# Patient Record
Sex: Female | Born: 1970
Health system: Southern US, Community
[De-identification: ages and names within clinical notes are randomized; demographics above are authoritative.]

## PROBLEM LIST (undated history)

## (undated) DIAGNOSIS — M1990 Unspecified osteoarthritis, unspecified site: Secondary | ICD-10-CM

## (undated) DIAGNOSIS — F419 Anxiety disorder, unspecified: Secondary | ICD-10-CM

## (undated) DIAGNOSIS — F329 Major depressive disorder, single episode, unspecified: Secondary | ICD-10-CM

## (undated) DIAGNOSIS — K219 Gastro-esophageal reflux disease without esophagitis: Secondary | ICD-10-CM

## (undated) DIAGNOSIS — K602 Anal fissure, unspecified: Secondary | ICD-10-CM

## (undated) DIAGNOSIS — G43909 Migraine, unspecified, not intractable, without status migrainosus: Secondary | ICD-10-CM

## (undated) DIAGNOSIS — F32A Depression, unspecified: Secondary | ICD-10-CM

## (undated) DIAGNOSIS — E785 Hyperlipidemia, unspecified: Secondary | ICD-10-CM

## (undated) DIAGNOSIS — R7611 Nonspecific reaction to tuberculin skin test without active tuberculosis: Secondary | ICD-10-CM

## (undated) DIAGNOSIS — K589 Irritable bowel syndrome without diarrhea: Secondary | ICD-10-CM

## (undated) HISTORY — DX: Anxiety disorder, unspecified: F41.9

## (undated) HISTORY — PX: ANKLE SURGERY: SHX546

## (undated) HISTORY — DX: Depression, unspecified: F32.A

## (undated) HISTORY — DX: Gastro-esophageal reflux disease without esophagitis: K21.9

## (undated) HISTORY — DX: Migraine, unspecified, not intractable, without status migrainosus: G43.909

## (undated) HISTORY — DX: Unspecified osteoarthritis, unspecified site: M19.90

## (undated) HISTORY — DX: Major depressive disorder, single episode, unspecified: F32.9

## (undated) HISTORY — DX: Irritable bowel syndrome, unspecified: K58.9

## (undated) HISTORY — DX: Nonspecific reaction to tuberculin skin test without active tuberculosis: R76.11

## (undated) HISTORY — PX: APPENDECTOMY: SHX54

## (undated) HISTORY — DX: Hyperlipidemia, unspecified: E78.5

## (undated) HISTORY — DX: Anal fissure, unspecified: K60.2

## (undated) HISTORY — PX: KNEE ARTHROSCOPY: SHX127

---

## 2004-11-12 ENCOUNTER — Other Ambulatory Visit: Admission: RE | Admit: 2004-11-12 | Discharge: 2004-11-12 | Payer: Self-pay | Admitting: Obstetrics and Gynecology

## 2005-08-15 ENCOUNTER — Ambulatory Visit (HOSPITAL_COMMUNITY): Admission: RE | Admit: 2005-08-15 | Discharge: 2005-08-15 | Payer: Self-pay | Admitting: Obstetrics and Gynecology

## 2005-08-15 IMAGING — RF DG HYSTEROGRAM
8 series · 8 of 8 positions shown · IV contrast (omnipaque)
Comparison: none

CLINICAL DATA: nfertility.
HYSTEROSALPINGOGRAM:
TECHNIQUE: Following cleansing of the cervix and vagina with Betadine, a hysterosalpingogram catheter was placed into the endocervical canal.  Hysterosalpingogram was then performed using Omnipaque 300 contrast.  The patient tolerated the examination without difficulty.

[Series 1: run · 1 of 1 slices shown (1 of 8)]
[im 1/1]
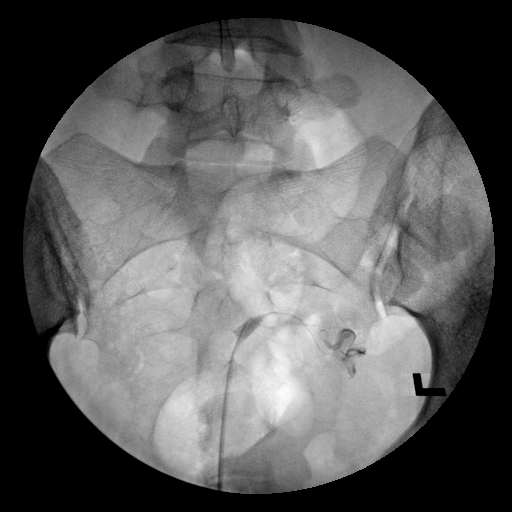

[Series 2: run · 1 of 1 slices shown (2 of 8)]
[im 1/1]
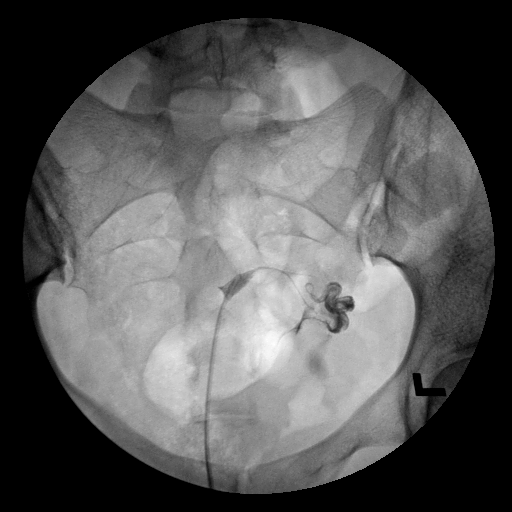

[Series 3: run · 1 of 1 slices shown (3 of 8)]
[im 1/1]
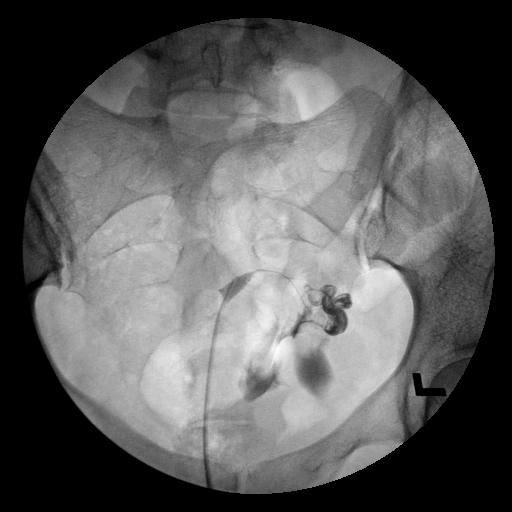

[Series 4: run · 1 of 1 slices shown (4 of 8)]
[im 1/1]
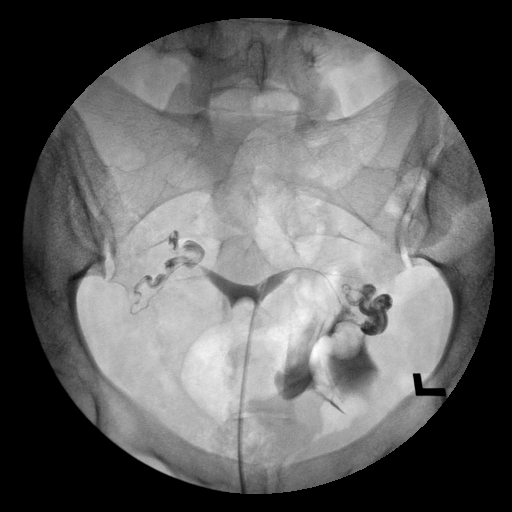

[Series 5: run · 1 of 1 slices shown (5 of 8)]
[im 1/1]
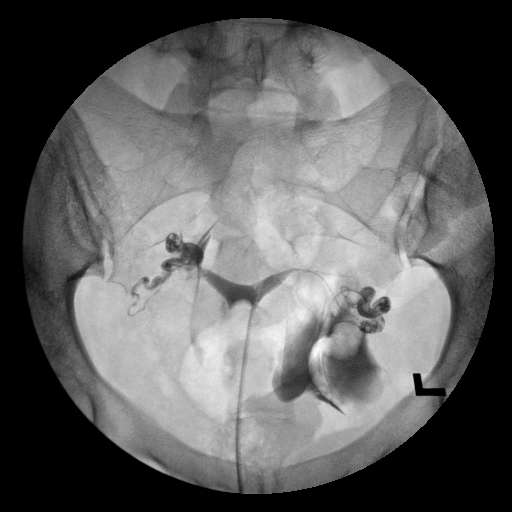

[Series 6: run · 1 of 1 slices shown (6 of 8)]
[im 1/1]
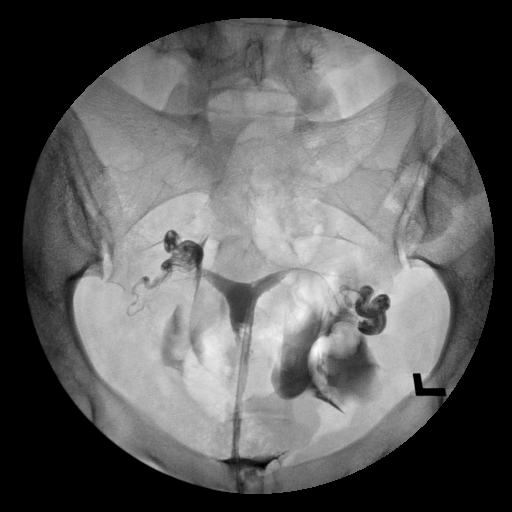

[Series 7: run · 1 of 1 slices shown (7 of 8)]
[im 1/1]
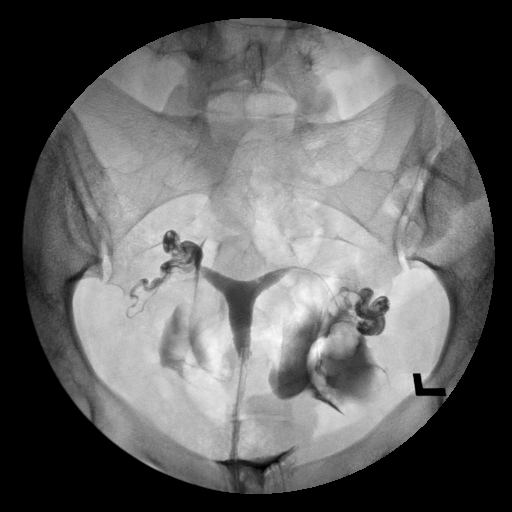

[Series 8: run · 1 of 1 slices shown (8 of 8)]
[im 1/1]
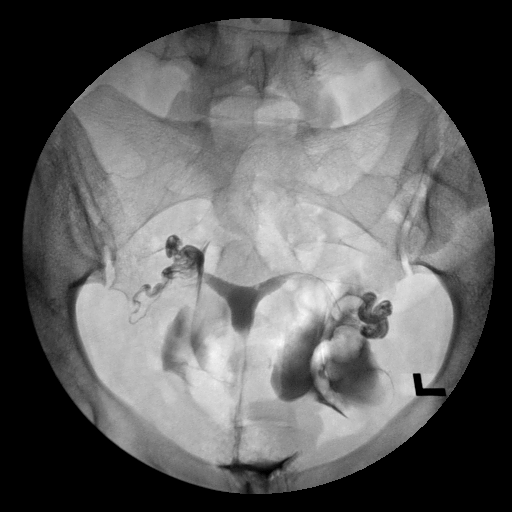

[8 of 8 positions shown; findings below may reference images not displayed]

FINDINGS: Incidentally noted is an arcuate configuration of the endometrial cavity, which is a normal variant.  Endometrial cavity is otherwise normal in appearance. 
Opacification of both fallopian tubes is seen.  Both fallopian tubes are normal in appearance.  Intraperitoneal spill of contrast in both fallopian tubes is demonstrated.
IMPRESSION: Normal study.  Both fallopian tubes are patent.

## 2005-12-05 ENCOUNTER — Other Ambulatory Visit: Admission: RE | Admit: 2005-12-05 | Discharge: 2005-12-05 | Payer: Self-pay | Admitting: Obstetrics and Gynecology

## 2006-03-30 ENCOUNTER — Encounter: Admission: RE | Admit: 2006-03-30 | Discharge: 2006-03-30 | Payer: Self-pay | Admitting: Obstetrics and Gynecology

## 2006-05-08 ENCOUNTER — Inpatient Hospital Stay (HOSPITAL_COMMUNITY): Admission: AD | Admit: 2006-05-08 | Discharge: 2006-05-10 | Payer: Self-pay | Admitting: Obstetrics and Gynecology

## 2006-05-08 ENCOUNTER — Encounter (INDEPENDENT_AMBULATORY_CARE_PROVIDER_SITE_OTHER): Payer: Self-pay | Admitting: Specialist

## 2006-05-11 ENCOUNTER — Encounter: Admission: RE | Admit: 2006-05-11 | Discharge: 2006-06-07 | Payer: Self-pay | Admitting: Obstetrics and Gynecology

## 2007-07-10 ENCOUNTER — Other Ambulatory Visit: Admission: RE | Admit: 2007-07-10 | Discharge: 2007-07-10 | Payer: Self-pay | Admitting: Internal Medicine

## 2008-07-10 ENCOUNTER — Other Ambulatory Visit: Admission: RE | Admit: 2008-07-10 | Discharge: 2008-07-10 | Payer: Self-pay | Admitting: Internal Medicine

## 2008-07-21 ENCOUNTER — Ambulatory Visit (HOSPITAL_COMMUNITY): Admission: RE | Admit: 2008-07-21 | Discharge: 2008-07-21 | Payer: Self-pay | Admitting: Internal Medicine

## 2009-07-13 ENCOUNTER — Other Ambulatory Visit: Admission: RE | Admit: 2009-07-13 | Discharge: 2009-07-13 | Payer: Self-pay | Admitting: Internal Medicine

## 2010-07-13 ENCOUNTER — Ambulatory Visit (HOSPITAL_COMMUNITY)
Admission: RE | Admit: 2010-07-13 | Discharge: 2010-07-13 | Payer: Self-pay | Source: Home / Self Care | Attending: Internal Medicine | Admitting: Internal Medicine

## 2010-11-12 NOTE — Discharge Summary (Signed)
NAME:  Maria Arias, Maria Arias NO.:  [id number]   MEDICAL RECORD NO.:  [id number]          PATIENT TYPE:  INP   LOCATION:                                FACILITY:  [facility]   PHYSICIAN:  Huel Cote, M.D. DATE OF BIRTH:  1970-12-22   DATE OF ADMISSION:  05/08/2006  DATE OF DISCHARGE:  05/10/2006                               DISCHARGE SUMMARY   DISCHARGE DIAGNOSES:  1. Preterm pregnancy at 34-5/7th's weeks, delivered.  2. Spontaneous rupture of membranes and spontaneous labor after that.  3. Status post precipitous vaginal delivery.   DISCHARGE MEDICATIONS:  1. Motrin 600 mg p.o. every 6 hours.  2. Percocet 1-2 tablets p.o. every 4 hours p.r.n.   DISCHARGE FOLLOWUP:  The patient is to follow up in 6 weeks for her  routine postpartum exam.   HOSPITAL COURSE:  The patient is a 40 year old, G1 P0, who was admitted  at 34-5/7th's weeks' gestation, after she presented to the office with a  spontaneous rupture of membranes and onset of painful contractions.  In  the office she was noted to be completely effaced, 4+ centimeters and a  zero station and was sent immediately to labor and delivery.  Prenatal  care been complicated by gestational diabetes which was well controlled  on her diet.  She did have advanced maternal age with a normal first  trimester screen and some history of anxiety and depression.  However,  was stable off Zoloft throughout her pregnancy.   Prenatal labs are as follows:  B+, antibody negative, RPR nonreactive,  rubella immune, hepatitis B surface antigen negative, GC negative,  chlamydia negative, group B strep was unknown.   PAST OBSTETRICAL HISTORY:  None.   PAST GYNECOLOGIC HISTORY:  History of fertility with conception on  Clomid.   PAST MEDICAL HISTORY:  1. Irritable bowel syndrome.  2. Depression.  3. Anxiety.   PAST SURGICAL HISTORY:  Appendectomy in 1985.   SHE HAD NO KNOWN DRUG ALLERGIES.   Shortly after admission, the patient  was examined and found to be  completely effaced  and 7-to-8-cm dilated.  She was given ampicillin 2  grams IV for questionable group B strep status, and her preterm  gestational age.  She quickly progressed to complete dilation after  arrival in L&D and pushed for approximately 30 minutes with a normal  spontaneous vaginal delivery of a viable female infant over a first-  degree laceration.  Apgar's were 8 and 9, weight was 4 pounds 12 ounces.  There was a nuchal cord x1 which was reduced over the head.  Placenta  delivered spontaneously.  A 1% lidocaine block was used for repair of  the laceration with 2-0 Vicryl.  NICU was in attendance with the baby  taken to NICU, given the early gestational age and history of diabetes  in the mother.  The infant had initially done quite well and was  breathing unassisted initially in the delivery room.  The patient was  then admitted for routine postpartum care.  By postpartum day #2, she  was doing well.  No complaints.  Fundus was  firm.  She was afebrile with  stable vital signs.  The baby was in the NICU, however, was quite stable  and was just being followed for its early gestational age, therefore,  the patient was felt stable for discharge home and was discharged to  follow up in the office in 6 weeks.      Huel Cote, M.D.  Electronically Signed     KR/MEDQ  D:  08/04/2006  T:  08/04/2006  Job:  [id number]

## 2011-07-12 ENCOUNTER — Other Ambulatory Visit (HOSPITAL_COMMUNITY): Payer: Self-pay | Admitting: Emergency Medicine

## 2011-07-12 DIAGNOSIS — Z1231 Encounter for screening mammogram for malignant neoplasm of breast: Secondary | ICD-10-CM

## 2011-08-03 ENCOUNTER — Other Ambulatory Visit: Payer: Self-pay | Admitting: Emergency Medicine

## 2011-08-03 ENCOUNTER — Other Ambulatory Visit (HOSPITAL_COMMUNITY)
Admission: RE | Admit: 2011-08-03 | Discharge: 2011-08-03 | Disposition: A | Discharge status: Home / Self Care | Payer: BC Managed Care – PPO | Source: Ambulatory Visit | Attending: Internal Medicine | Admitting: Internal Medicine

## 2011-08-03 DIAGNOSIS — Z01419 Encounter for gynecological examination (general) (routine) without abnormal findings: Secondary | ICD-10-CM | POA: Insufficient documentation

## 2011-08-04 LAB — HM PAP SMEAR: HM Pap smear: NEGATIVE

## 2011-08-05 ENCOUNTER — Other Ambulatory Visit (HOSPITAL_COMMUNITY): Payer: Self-pay | Admitting: Internal Medicine

## 2011-08-05 ENCOUNTER — Other Ambulatory Visit: Payer: Self-pay | Admitting: Internal Medicine

## 2011-08-05 DIAGNOSIS — N63 Unspecified lump in unspecified breast: Secondary | ICD-10-CM

## 2011-08-05 DIAGNOSIS — M8580 Other specified disorders of bone density and structure, unspecified site: Secondary | ICD-10-CM

## 2011-08-05 DIAGNOSIS — N644 Mastodynia: Secondary | ICD-10-CM

## 2011-08-10 ENCOUNTER — Ambulatory Visit (HOSPITAL_COMMUNITY): Payer: BC Managed Care – PPO

## 2011-08-10 ENCOUNTER — Ambulatory Visit (HOSPITAL_COMMUNITY): Payer: Self-pay

## 2011-08-15 ENCOUNTER — Ambulatory Visit
Admission: RE | Admit: 2011-08-15 | Discharge: 2011-08-15 | Disposition: A | Discharge status: Home / Self Care | Payer: BC Managed Care – PPO | Source: Ambulatory Visit | Attending: Internal Medicine | Admitting: Internal Medicine

## 2011-08-15 ENCOUNTER — Ambulatory Visit (HOSPITAL_COMMUNITY)
Admission: RE | Admit: 2011-08-15 | Discharge: 2011-08-15 | Disposition: A | Discharge status: Home / Self Care | Payer: BC Managed Care – PPO | Source: Ambulatory Visit | Attending: Internal Medicine | Admitting: Internal Medicine

## 2011-08-15 ENCOUNTER — Other Ambulatory Visit: Payer: Self-pay | Admitting: Internal Medicine

## 2011-08-15 DIAGNOSIS — N63 Unspecified lump in unspecified breast: Secondary | ICD-10-CM

## 2011-08-15 DIAGNOSIS — N644 Mastodynia: Secondary | ICD-10-CM

## 2011-08-15 DIAGNOSIS — Z13820 Encounter for screening for osteoporosis: Secondary | ICD-10-CM | POA: Insufficient documentation

## 2011-08-15 DIAGNOSIS — M8580 Other specified disorders of bone density and structure, unspecified site: Secondary | ICD-10-CM

## 2011-08-15 LAB — HM DEXA SCAN: HM DEXA SCAN: NEGATIVE

## 2011-08-15 LAB — HM MAMMOGRAPHY: HM MAMMO: NEGATIVE

## 2012-08-13 ENCOUNTER — Ambulatory Visit (HOSPITAL_COMMUNITY)
Admission: RE | Admit: 2012-08-13 | Discharge: 2012-08-13 | Disposition: A | Discharge status: Home / Self Care | Payer: BC Managed Care – PPO | Source: Ambulatory Visit | Attending: Internal Medicine | Admitting: Internal Medicine

## 2012-08-13 ENCOUNTER — Other Ambulatory Visit (HOSPITAL_COMMUNITY): Payer: Self-pay | Admitting: Emergency Medicine

## 2012-08-13 ENCOUNTER — Other Ambulatory Visit: Payer: Self-pay | Admitting: Internal Medicine

## 2012-08-13 ENCOUNTER — Other Ambulatory Visit (HOSPITAL_COMMUNITY): Payer: Self-pay | Admitting: Internal Medicine

## 2012-08-13 DIAGNOSIS — R7611 Nonspecific reaction to tuberculin skin test without active tuberculosis: Secondary | ICD-10-CM | POA: Insufficient documentation

## 2012-08-13 DIAGNOSIS — Z1231 Encounter for screening mammogram for malignant neoplasm of breast: Secondary | ICD-10-CM

## 2012-08-13 DIAGNOSIS — R51 Headache: Secondary | ICD-10-CM

## 2012-08-13 IMAGING — CR DG CHEST 2V
2 series · 2 of 2 positions shown · non-contrast
Comparison: No priors.

CLINICAL DATA: Positive PPD.  No current chest complaints.

CHEST - 2 VIEW

[view not recorded (1 of 2)]
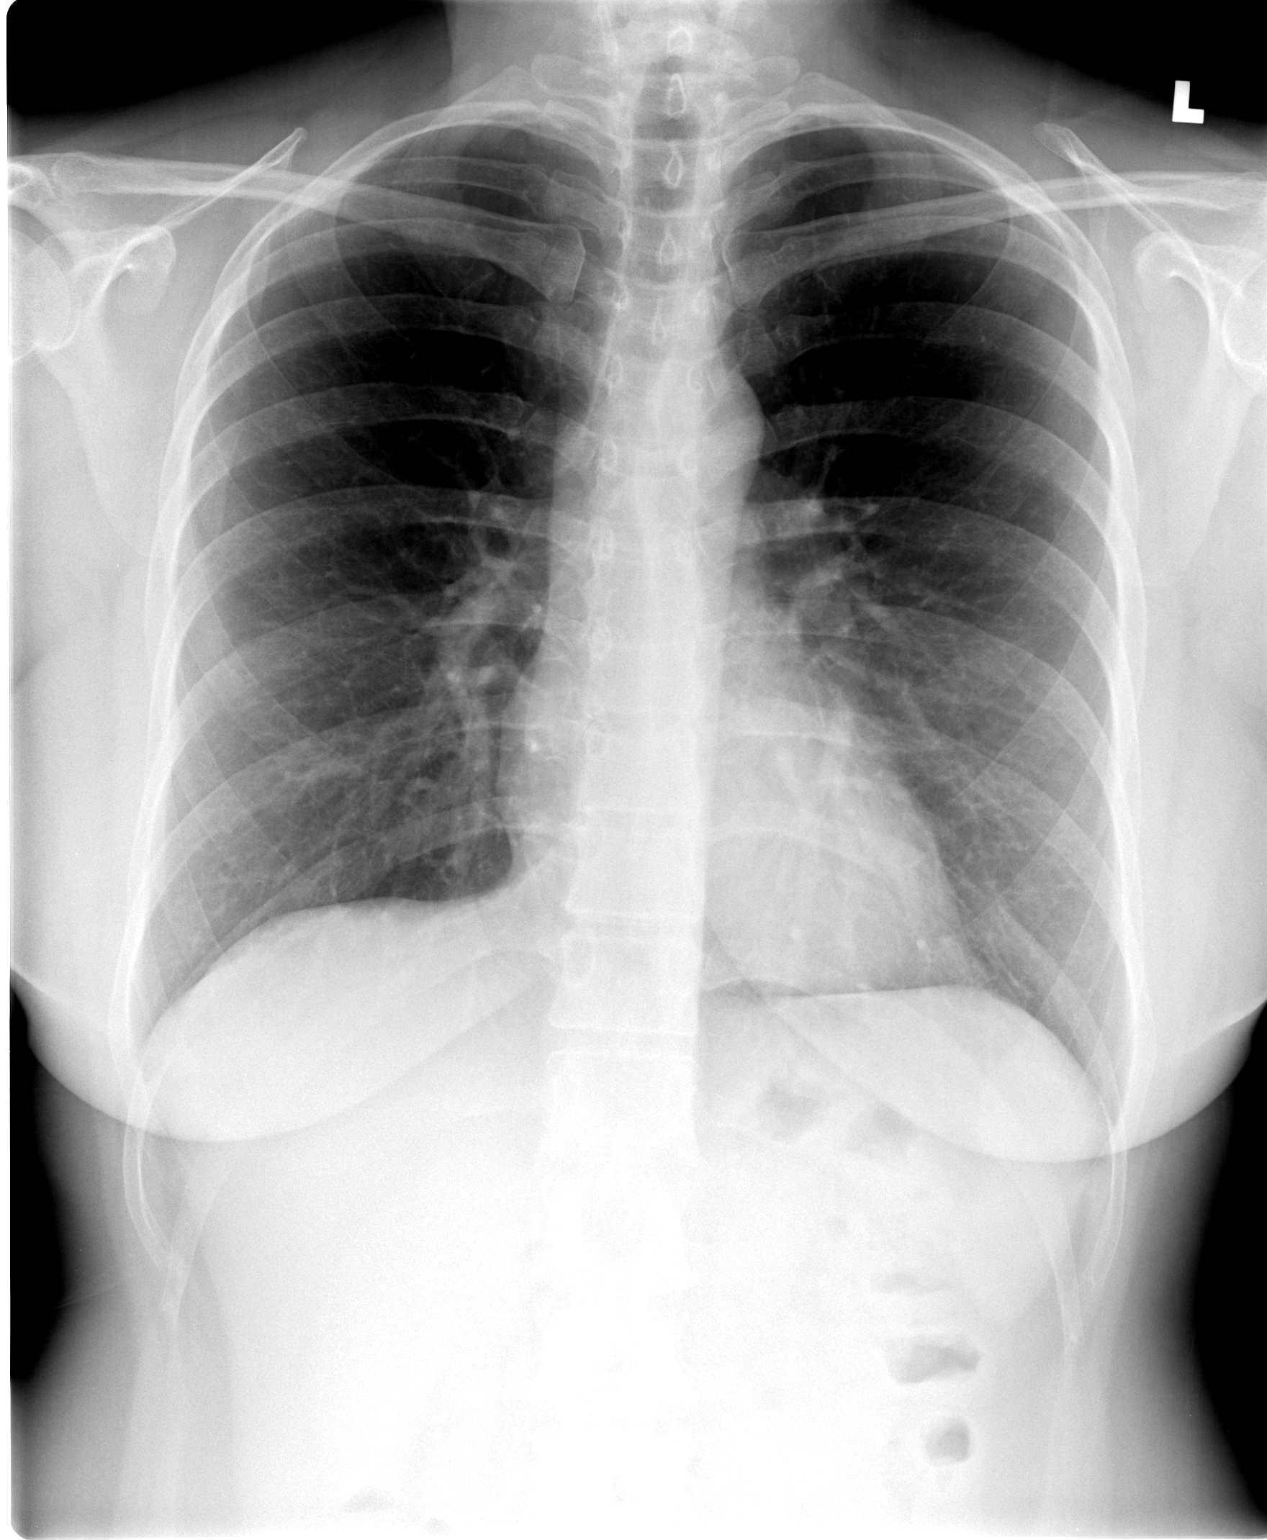

[view not recorded (2 of 2)]
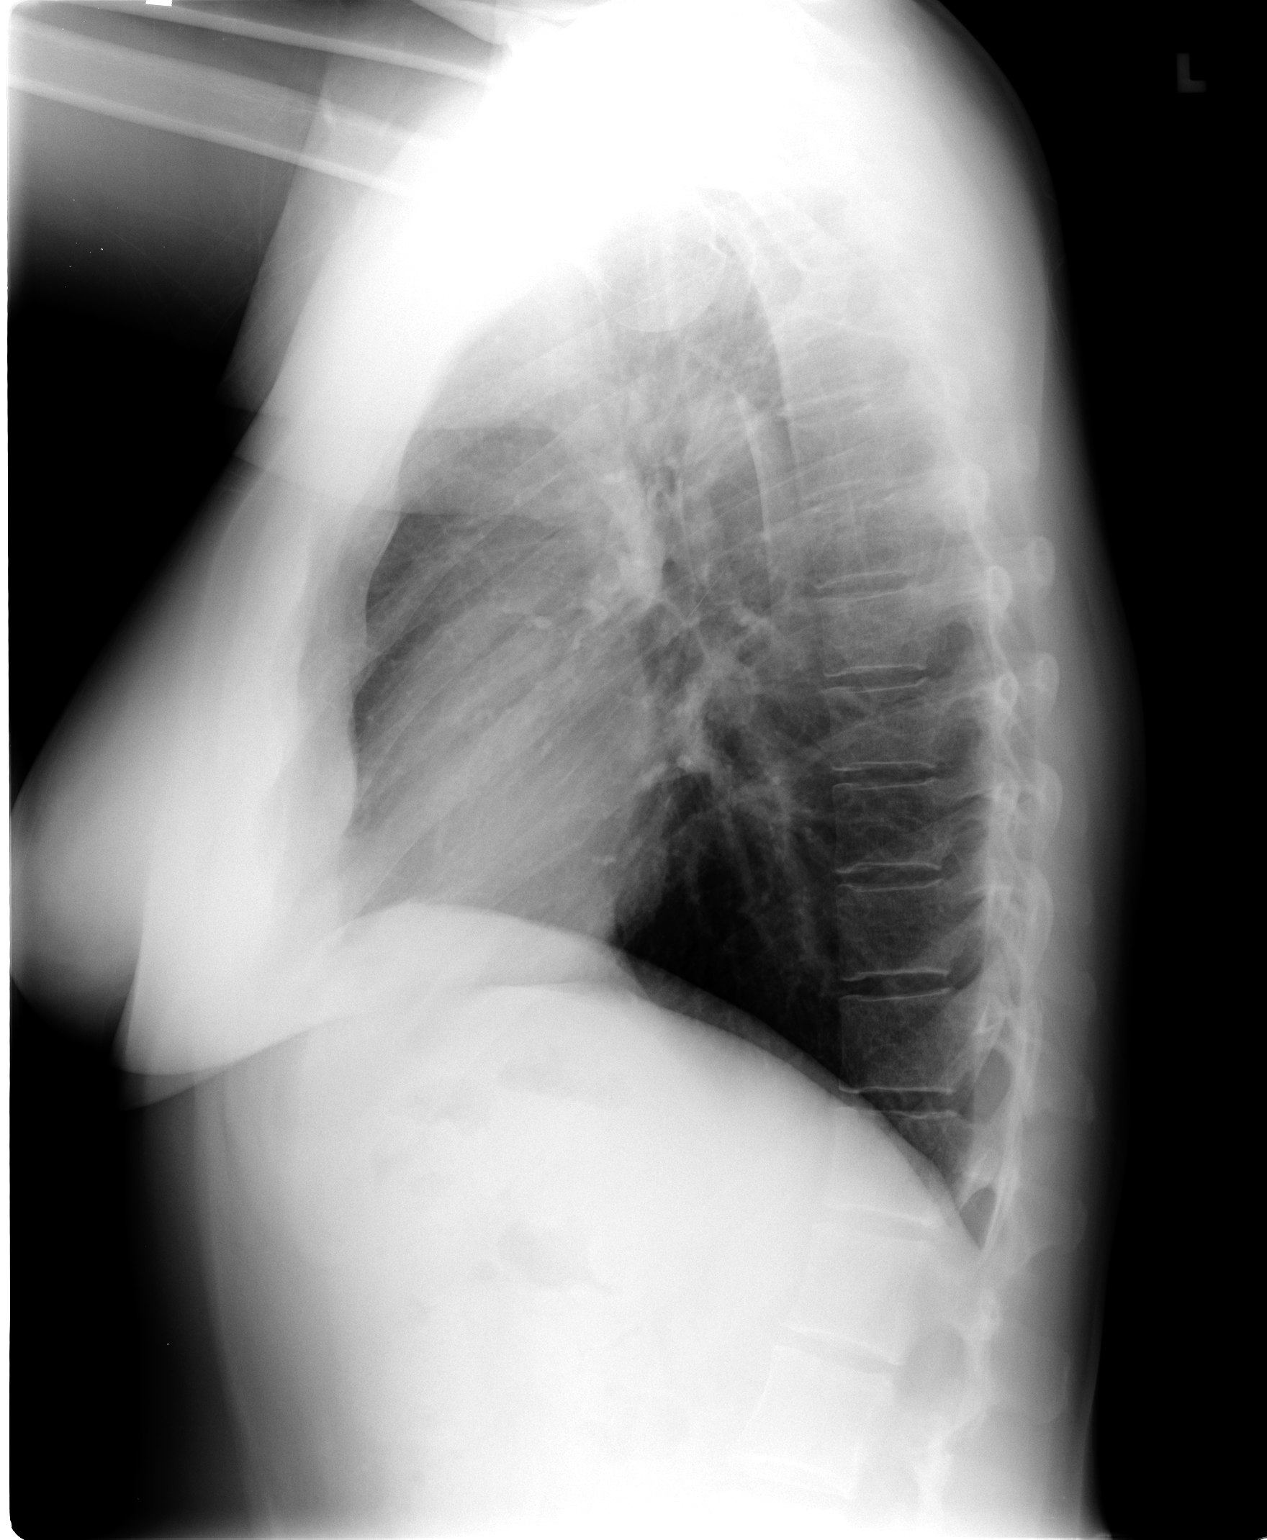

[2 of 2 positions shown; findings below may reference images not displayed]

FINDINGS: Lung volumes are normal.  No consolidative airspace
disease.  No pleural effusions.  No pneumothorax.  No pulmonary
nodule or mass noted.  Pulmonary vasculature and the
cardiomediastinal silhouette are within normal limits.
IMPRESSION: 1. No radiographic evidence of acute cardiopulmonary disease.
2.  Specifically, no radiographic findings to suggest active
intrathoracic tuberculosis.

## 2012-08-16 ENCOUNTER — Other Ambulatory Visit: Payer: BC Managed Care – PPO

## 2012-08-16 ENCOUNTER — Ambulatory Visit
Admission: RE | Admit: 2012-08-16 | Discharge: 2012-08-16 | Disposition: A | Discharge status: Home / Self Care | Payer: BC Managed Care – PPO | Source: Ambulatory Visit | Attending: Internal Medicine | Admitting: Internal Medicine

## 2012-08-16 DIAGNOSIS — R51 Headache: Secondary | ICD-10-CM

## 2012-08-16 IMAGING — CT CT HEAD W/O CM
2 series · 16 of 30 positions shown, 20 images · non-contrast
Comparison: None.

CLINICAL DATA: .  Headache

CT HEAD WITHOUT CONTRAST
TECHNIQUE: Contiguous axial images were obtained from the base of
the skull through the vertex without contrast

[Series 2: head w/o · axial · non-contrast · 0.45mm/px · z∈[+28,+154]mm · 13 of 28 slices shown, 17 images]
[im 2/28  brain]
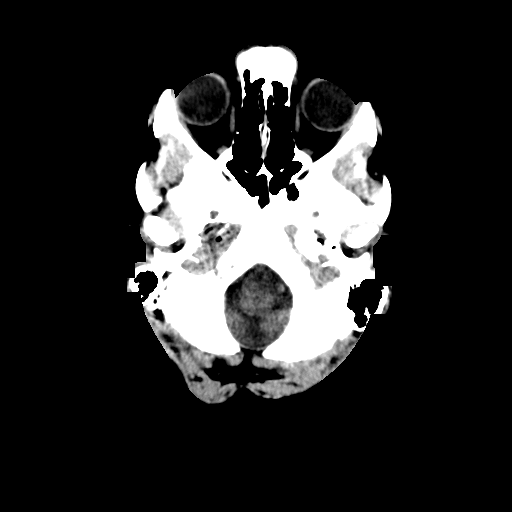
[im 2/28  bone]
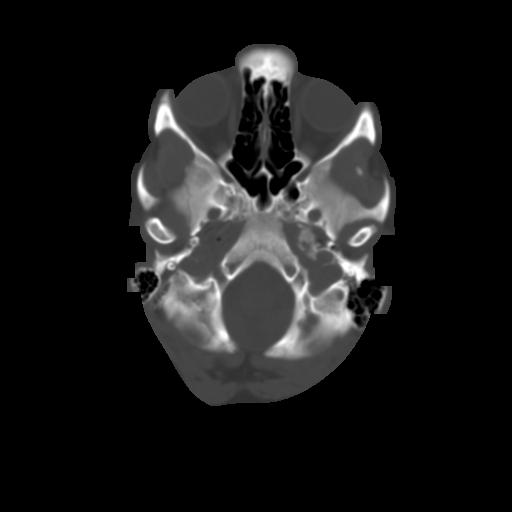
[im 4/28  brain]
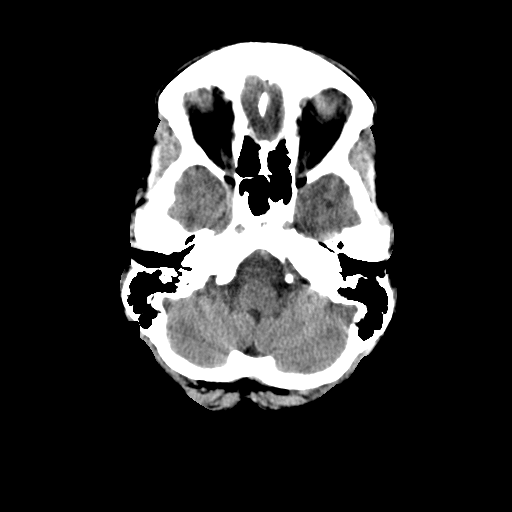
[im 6/28  brain]
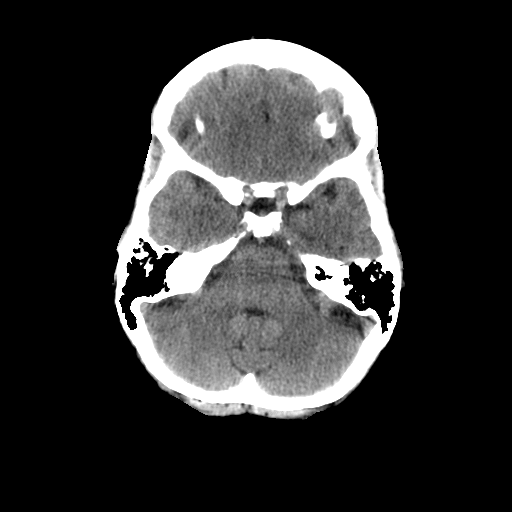
[im 8/28  brain]
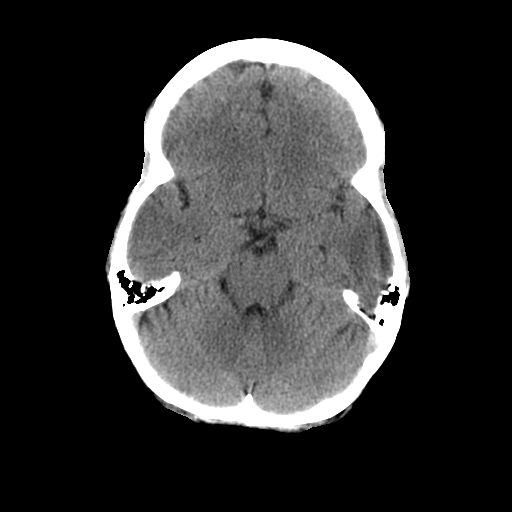
[im 10/28  brain]
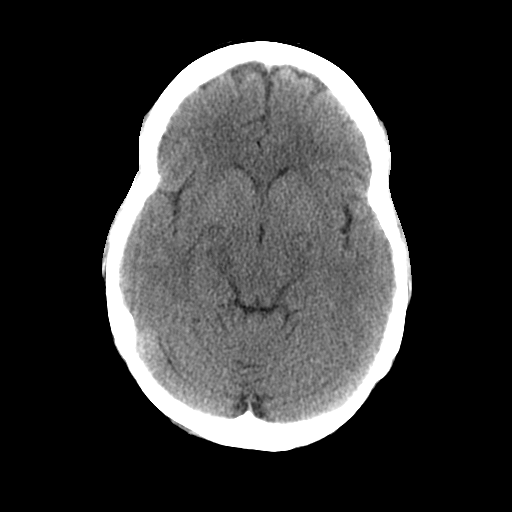
[im 10/28  bone]
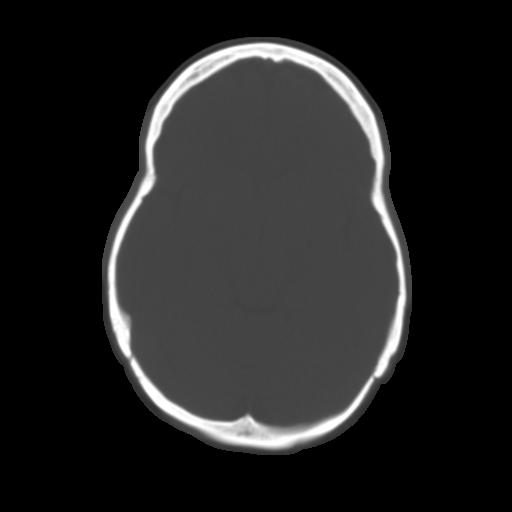
[im 12/28  brain]
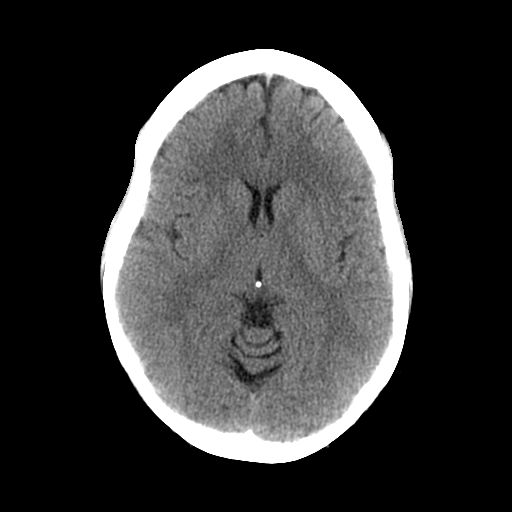
[im 14/28  brain]
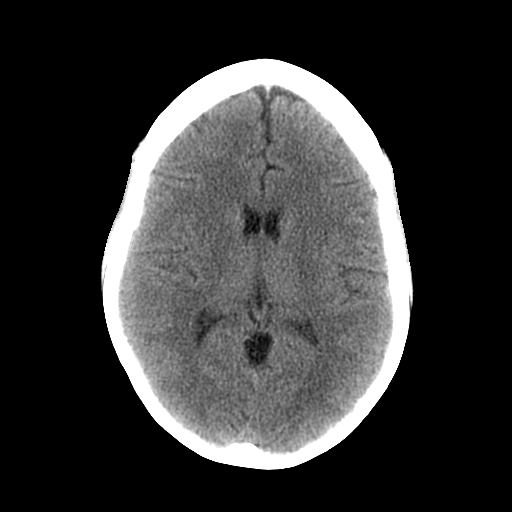
[im 16/28  brain]
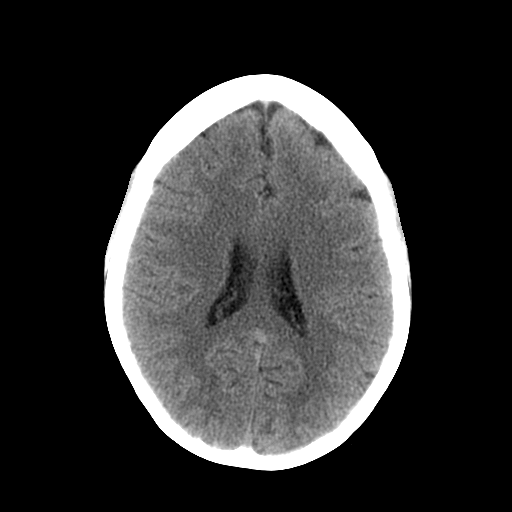
[im 18/28  brain]
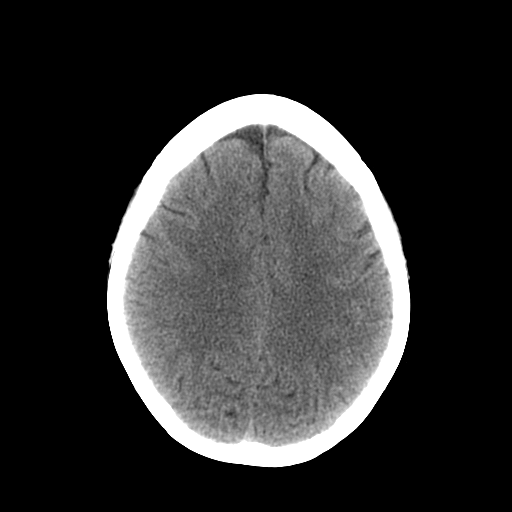
[im 18/28  bone]
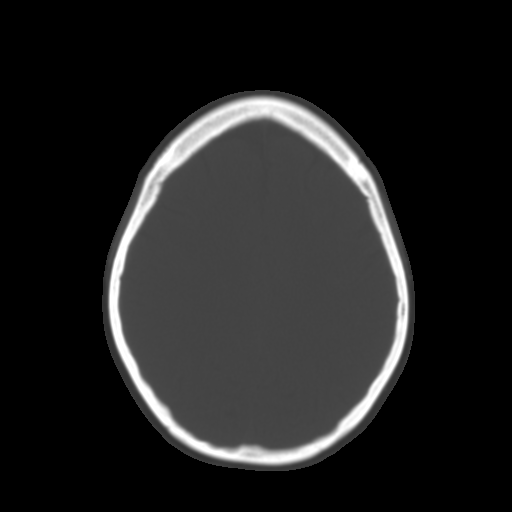
[im 20/28  brain]
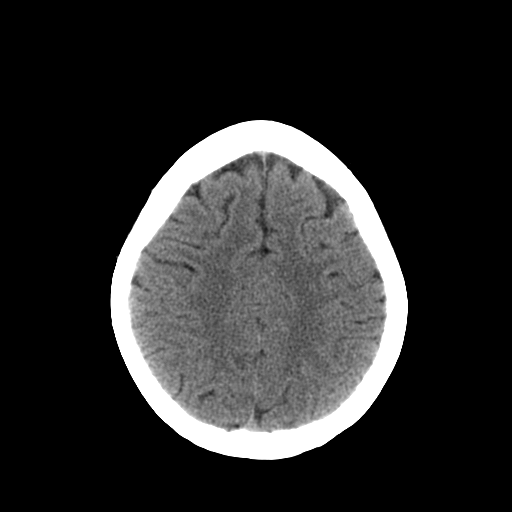
[im 22/28  brain]
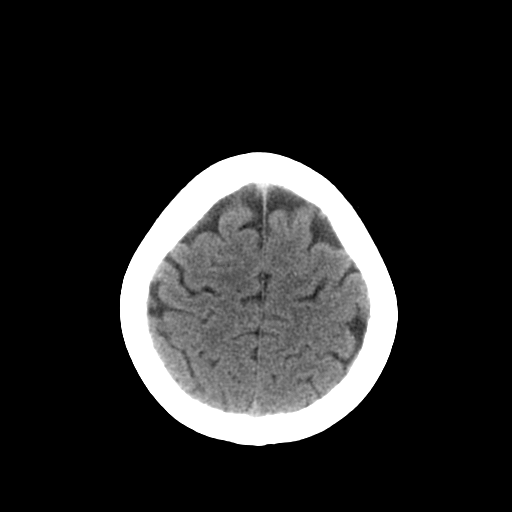
[im 24/28  brain]
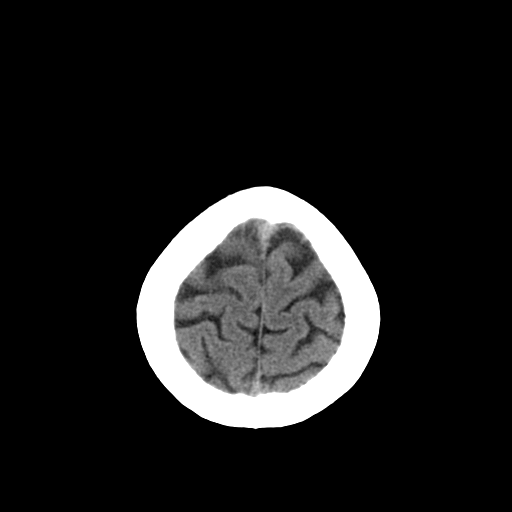
[im 26/28  brain]
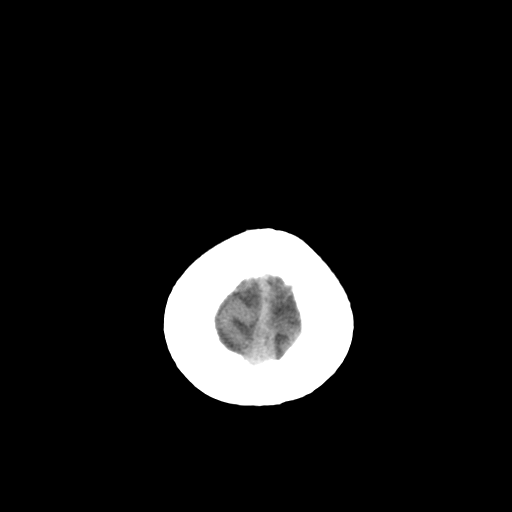
[im 26/28  bone]
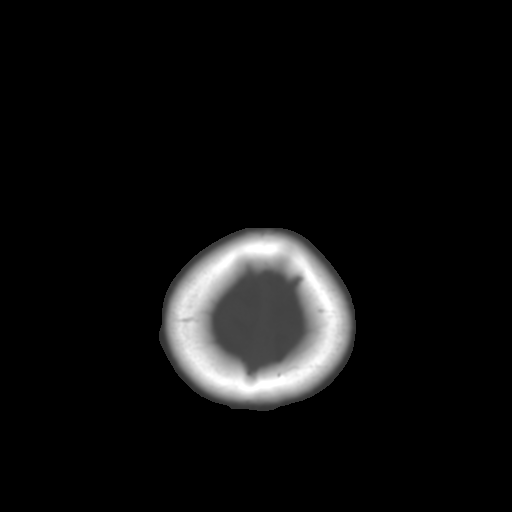

[Series 3: head bone · axial · 0.45mm/px · z∈[+28,+70]mm · 3 of 28 slices shown]
[im 2/28  bone]
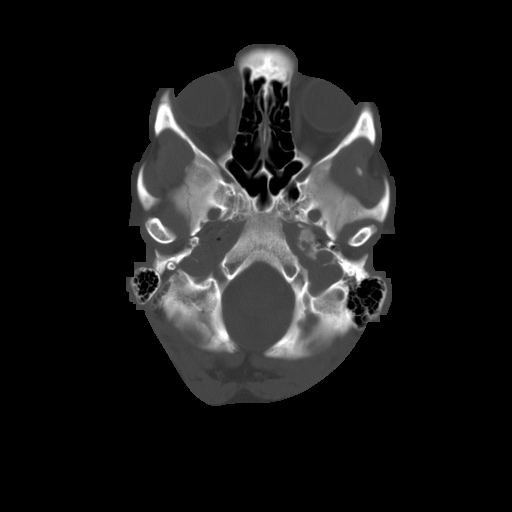
[im 6/28  bone]
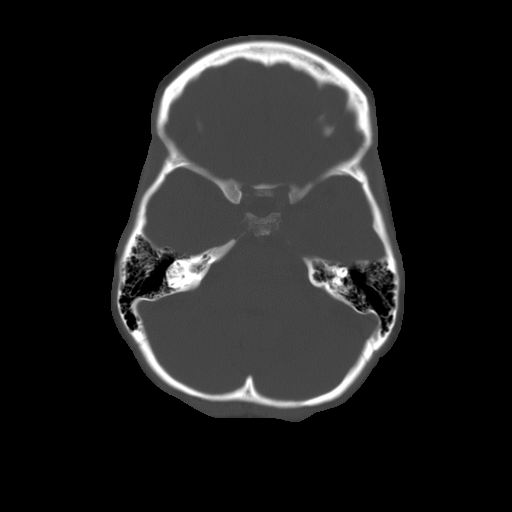
[im 10/28  bone]
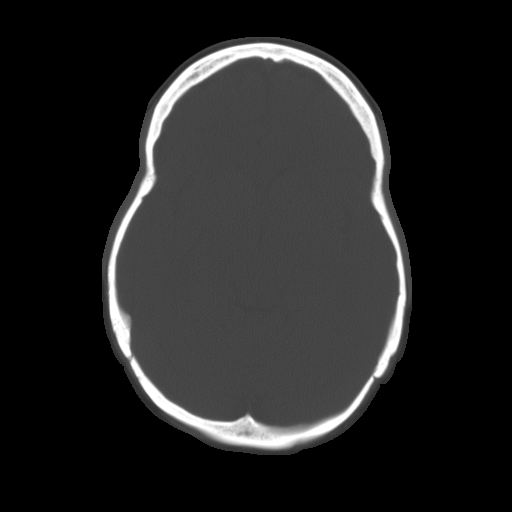

[16 of 30 positions shown; findings below may reference images not displayed]

FINDINGS: The brain has a normal appearance without evidence for
hemorrhage, acute infarction, hydrocephalus, or mass lesion.  There
is no extra axial fluid collection.  The skull and paranasal
sinuses are normal.
IMPRESSION: Normal CT of the head without contrast.

## 2012-08-24 ENCOUNTER — Ambulatory Visit (HOSPITAL_COMMUNITY): Payer: BC Managed Care – PPO

## 2012-08-31 ENCOUNTER — Ambulatory Visit (HOSPITAL_COMMUNITY)
Admission: RE | Admit: 2012-08-31 | Discharge: 2012-08-31 | Disposition: A | Discharge status: Home / Self Care | Payer: BC Managed Care – PPO | Source: Ambulatory Visit | Attending: Emergency Medicine | Admitting: Emergency Medicine

## 2012-08-31 DIAGNOSIS — Z1231 Encounter for screening mammogram for malignant neoplasm of breast: Secondary | ICD-10-CM | POA: Insufficient documentation

## 2013-05-15 ENCOUNTER — Ambulatory Visit: Payer: BC Managed Care – PPO | Admitting: Physician Assistant

## 2013-05-15 ENCOUNTER — Encounter: Payer: Self-pay | Admitting: Physician Assistant

## 2013-05-15 VITALS — BP 128/78 | HR 96 | Resp 16 | Ht 63.5 in | Wt 159.0 lb

## 2013-05-15 DIAGNOSIS — E785 Hyperlipidemia, unspecified: Secondary | ICD-10-CM | POA: Insufficient documentation

## 2013-05-15 DIAGNOSIS — F329 Major depressive disorder, single episode, unspecified: Secondary | ICD-10-CM | POA: Insufficient documentation

## 2013-05-15 DIAGNOSIS — G43909 Migraine, unspecified, not intractable, without status migrainosus: Secondary | ICD-10-CM | POA: Insufficient documentation

## 2013-05-15 DIAGNOSIS — F419 Anxiety disorder, unspecified: Secondary | ICD-10-CM | POA: Insufficient documentation

## 2013-05-15 DIAGNOSIS — K219 Gastro-esophageal reflux disease without esophagitis: Secondary | ICD-10-CM | POA: Insufficient documentation

## 2013-05-15 DIAGNOSIS — J029 Acute pharyngitis, unspecified: Secondary | ICD-10-CM

## 2013-05-15 DIAGNOSIS — R7611 Nonspecific reaction to tuberculin skin test without active tuberculosis: Secondary | ICD-10-CM | POA: Insufficient documentation

## 2013-05-15 MED ORDER — PROMETHAZINE-CODEINE 6.25-10 MG/5ML PO SYRP: 5.0000 mL | ORAL_SOLUTION | Freq: Four times a day (QID) | ORAL | Status: DC | PRN

## 2013-05-15 MED ORDER — PREDNISONE (PAK) 10 MG PO TABS: ORAL_TABLET | ORAL | Status: DC

## 2013-05-15 MED ORDER — AZITHROMYCIN 250 MG PO TABS: 250.0000 mg | ORAL_TABLET | Freq: Every day | ORAL | Status: DC

## 2013-05-15 NOTE — Progress Notes (Signed)
  Subjective:    Patient ID: Maria Arias, female    DOB: 02-09-1971, 42 y.o.   MRN: [id number]  Cough This is a new problem. The current episode started in the past 7 days. The problem has been gradually worsening. The problem occurs constantly. The cough is productive of sputum. Associated symptoms include chills, ear congestion, nasal congestion, postnasal drip and a sore throat. Pertinent negatives include no ear pain, fever, headaches, hemoptysis, myalgias, rash, rhinorrhea, shortness of breath, sweats, weight loss or wheezing. Exacerbated by: dry heat. She has tried nothing for the symptoms. The treatment provided no relief. Her past medical history is significant for environmental allergies.   Past Medical History  Diagnosis Date  . GERD (gastroesophageal reflux disease)   . Hyperlipidemia   . Anxiety   . Depression   . Migraines   . 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   No current facility-administered medications on file prior to visit.    Review of Systems  Constitutional: Positive for chills. Negative for fever and weight loss.  HENT: Positive for congestion, postnasal drip, sinus pressure and sore throat. Negative for ear pain, mouth sores, nosebleeds and rhinorrhea.   Eyes: Negative.   Respiratory: Positive for cough and chest tightness. Negative for apnea, hemoptysis, choking, shortness of breath, wheezing and stridor.   Cardiovascular: Negative.   Gastrointestinal: Negative.   Endocrine: Negative.   Genitourinary: Negative.   Musculoskeletal: Negative for myalgias.  Skin: Negative for rash.  Allergic/Immunologic: Positive for environmental allergies.  Neurological: Negative.  Negative for headaches.  Hematological: Negative.        Objective:   Physical Exam  Constitutional: She appears well-developed and well-nourished.  HENT:  Head: Normocephalic and atraumatic.  Right  Ear: Hearing normal. Tympanic membrane is not injected, not scarred, not perforated and not erythematous. A middle ear effusion is present.  Left Ear: Tympanic membrane is not injected, not scarred, not perforated and not erythematous. A middle ear effusion is present.  Mouth/Throat: Uvula is midline and mucous membranes are normal. Posterior oropharyngeal edema and posterior oropharyngeal erythema present. No oropharyngeal exudate or tonsillar abscesses.  Eyes: Conjunctivae are normal. Pupils are equal, round, and reactive to light.  Neck: Normal range of motion. Neck supple.  Cardiovascular: Normal rate, regular rhythm and normal heart sounds.   Pulmonary/Chest: Effort normal and breath sounds normal. No respiratory distress. She has no wheezes. She has no rales. She exhibits no tenderness.  Abdominal: Soft. Bowel sounds are normal.  Lymphadenopathy:    She has cervical adenopathy.  Skin: Skin is warm and dry.       Assessment & Plan:  1. Acute pharyngitis Rest, fluids, and call if needed.  - azithromycin (ZITHROMAX) 250 MG tablet; Take 1 tablet (250 mg total) by mouth daily.  Dispense: 6 each; Refill: 0 - promethazine-codeine (PHENERGAN WITH CODEINE) 6.25-10 MG/5ML syrup; Take 5 mLs by mouth every 6 (six) hours as needed for cough.  Dispense: 240 mL; Refill: 0 - predniSONE (STERAPRED UNI-PAK) 10 MG tablet; Take one tablet twice a day for 3 days and take one tablet daily with food for 4 days.  Dispense: 10 tablet; Refill: 0

## 2013-05-15 NOTE — Patient Instructions (Signed)

## 2013-06-03 ENCOUNTER — Other Ambulatory Visit: Payer: Self-pay | Admitting: Emergency Medicine

## 2013-06-03 MED ORDER — TRAZODONE HCL 100 MG PO TABS: ORAL_TABLET | ORAL | Status: DC

## 2013-06-04 ENCOUNTER — Other Ambulatory Visit: Payer: Self-pay | Admitting: Emergency Medicine

## 2013-06-04 MED ORDER — ETONOGESTREL-ETHINYL ESTRADIOL 0.12-0.015 MG/24HR VA RING: VAGINAL_RING | VAGINAL | Status: DC

## 2013-06-12 ENCOUNTER — Other Ambulatory Visit: Payer: Self-pay | Admitting: Emergency Medicine

## 2013-06-12 MED ORDER — AMOXICILLIN-POT CLAVULANATE 875-125 MG PO TABS: 1.0000 | ORAL_TABLET | Freq: Two times a day (BID) | ORAL | Status: AC

## 2013-06-12 MED ORDER — BENZONATATE 100 MG PO CAPS: 100.0000 mg | ORAL_CAPSULE | Freq: Three times a day (TID) | ORAL | Status: DC | PRN

## 2013-06-23 ENCOUNTER — Other Ambulatory Visit: Payer: Self-pay | Admitting: Emergency Medicine

## 2013-06-23 MED ORDER — SERTRALINE HCL 100 MG PO TABS: 100.0000 mg | ORAL_TABLET | Freq: Every day | ORAL | Status: DC

## 2013-07-05 ENCOUNTER — Other Ambulatory Visit: Payer: Self-pay | Admitting: Emergency Medicine

## 2013-07-05 MED ORDER — TRAZODONE HCL 100 MG PO TABS: ORAL_TABLET | ORAL | Status: DC

## 2013-07-08 ENCOUNTER — Encounter: Payer: Self-pay | Admitting: Emergency Medicine

## 2013-07-09 ENCOUNTER — Ambulatory Visit (INDEPENDENT_AMBULATORY_CARE_PROVIDER_SITE_OTHER): Payer: BC Managed Care – PPO | Admitting: Emergency Medicine

## 2013-07-11 ENCOUNTER — Encounter: Payer: Self-pay | Admitting: Emergency Medicine

## 2013-07-11 ENCOUNTER — Encounter: Payer: Self-pay | Admitting: Gastroenterology

## 2013-07-11 ENCOUNTER — Ambulatory Visit (INDEPENDENT_AMBULATORY_CARE_PROVIDER_SITE_OTHER): Payer: BC Managed Care – PPO | Admitting: Emergency Medicine

## 2013-07-11 VITALS — BP 108/78 | HR 82 | Temp 98.2°F | Resp 18 | Ht 63.5 in | Wt 155.0 lb

## 2013-07-11 DIAGNOSIS — K625 Hemorrhage of anus and rectum: Secondary | ICD-10-CM

## 2013-07-11 DIAGNOSIS — R5381 Other malaise: Secondary | ICD-10-CM

## 2013-07-11 DIAGNOSIS — R5383 Other fatigue: Principal | ICD-10-CM

## 2013-07-11 DIAGNOSIS — D649 Anemia, unspecified: Secondary | ICD-10-CM

## 2013-07-11 DIAGNOSIS — K649 Unspecified hemorrhoids: Secondary | ICD-10-CM

## 2013-07-11 DIAGNOSIS — R197 Diarrhea, unspecified: Secondary | ICD-10-CM

## 2013-07-11 DIAGNOSIS — E782 Mixed hyperlipidemia: Secondary | ICD-10-CM

## 2013-07-11 DIAGNOSIS — Z79899 Other long term (current) drug therapy: Secondary | ICD-10-CM

## 2013-07-11 DIAGNOSIS — E559 Vitamin D deficiency, unspecified: Secondary | ICD-10-CM

## 2013-07-11 NOTE — Progress Notes (Signed)
Subjective:    Patient ID: Maria Arias, female    DOB: 1970-10-09, 43 y.o.   MRN: [id number]  HPI Comments: 43 yo female cholesterol f/u. She is eating healthier and keeping busy. LAST LABS T 185 TG 116 H 56 L 106 D 28 She has lost 20 # since seperating from husband. She has had decreased stress but is still having IBS with diarrhea. She is now having increased hemorrhoid difficulty and has hx of fissures. She has noticed increased blood loss with hemorrhoids. She feels great and is doing well, she notes she is drinking a little more ETOH on weekends occasionally. She stays a little tired even with wt loss and decreased stress.    Current Outpatient Prescriptions on File Prior to Visit  Medication Sig Dispense Refill  . etonogestrel-ethinyl estradiol (NUVARING) 0.12-0.015 MG/24HR vaginal ring Insert vaginally and leave in place for 3 consecutive weeks, then remove for 1 week.  1 each  11  . sertraline (ZOLOFT) 100 MG tablet Take 1 tablet (100 mg total) by mouth daily. Take 1 and  A 1/2 pills =150mg  daily  135 tablet  0  . SUMAtriptan (IMITREX) 100 MG tablet Take 100 mg by mouth every 2 (two) hours as needed for migraine or headache. May repeat in 2 hours if headache persists or recurs.      . topiramate (TOPAMAX) 100 MG tablet Take 100 mg by mouth 2 (two) times daily.      . traZODone (DESYREL) 100 MG tablet Take 1.5 pills QHS  45 tablet  1  . esomeprazole (NEXIUM) 40 MG packet Take 40 mg by mouth daily before breakfast.      . lactase (LACTAID) 3000 UNITS tablet Take by mouth as needed.       No current facility-administered medications on file prior to visit.   ALLEGRIES Ppd Positive  Past Medical History  Diagnosis Date  . GERD (gastroesophageal reflux disease)   . Hyperlipidemia   . Anxiety   . Depression   . Migraines   . Positive PPD     Positive Gold PPD , Negative Chest Xray     Review of Systems  Constitutional: Positive for fatigue and unexpected weight change.   Gastrointestinal: Positive for diarrhea and anal bleeding.  All other systems reviewed and are negative.   BP 108/78  Pulse 82  Temp(Src) 98.2 F (36.8 C) (Temporal)  Resp 18  Ht 5' 3.5" (1.613 m)  Wt 155 lb (70.308 kg)  BMI 27.02 kg/m2  LMP 06/03/2013     Objective:   Physical Exam  Nursing note and vitals reviewed. Constitutional: She is oriented to person, place, and time. She appears well-developed and well-nourished. No distress.  HENT:  Head: Normocephalic and atraumatic.  Right Ear: External ear normal.  Left Ear: External ear normal.  Nose: Nose normal.  Mouth/Throat: Oropharynx is clear and moist.  Eyes: Conjunctivae and EOM are normal.  Neck: Normal range of motion. Neck supple. No JVD present. No thyromegaly present.  Cardiovascular: Normal rate, regular rhythm, normal heart sounds and intact distal pulses.   Pulmonary/Chest: Effort normal and breath sounds normal.  Abdominal: Soft. Bowel sounds are normal. She exhibits no distension and no mass. There is no tenderness. There is no rebound and no guarding.  Genitourinary:  NT, non-thrombosed dime size hemorrhoid  Musculoskeletal: Normal range of motion. She exhibits no edema and no tenderness.  Lymphadenopathy:    She has no cervical adenopathy.  Neurological: She is alert and oriented  to person, place, and time. No cranial nerve deficit.  Skin: Skin is warm and dry. No rash noted. No erythema. No pallor.  Psychiatric: She has a normal mood and affect. Her behavior is normal. Judgment and thought content normal.          Assessment & Plan:  1. Cholesterol- recheck labs, Need to eat healthier and exercise AD. 2. Wt Loss/ Fatigue- check labs, increase activity and H2O 3. Diarrhea/ Hemorrhoids/ rectal bleeding- Ref to GI, probiotics QD

## 2013-07-11 NOTE — Patient Instructions (Signed)
  Rectal Bleeding  Rectal bleeding is when blood comes out of the opening of the butt (anus). Rectal bleeding may show up as bright red blood or really dark poop (stool). The poop may look dark red, maroon, or black. Rectal bleeding is often a sign that something is wrong. This needs to be checked by a doctor.  HOME CARE  Eat a diet high in fiber. This will help keep your poop soft.  Limit activitiy.  Drink enough fluids to keep your pee (urine) clear or pale yellow.  Take a warm bath to soothe any pain.  Follow up with your doctor as told. GET HELP RIGHT AWAY IF:  You have more bleeding.  You have black or dark red poop.  You throw up (vomit) blood or it looks like coffee grounds.  You have belly (abdominal) pain or tenderness.  You have a fever.  You feel weak, sick to your stomach (nauseous), or you pass out (faint).  You have pain that is so bad you cannot poop (bowel movement). MAKE SURE YOU:  Understand these instructions.  Will watch your condition.  Will get help right away if you are not doing well or get worse. Document Released: 02/23/2011 Document Revised: 09/05/2011 Document Reviewed: 02/23/2011 [employer] Patient Information 2014 [employer].  

## 2013-07-12 LAB — HEPATIC FUNCTION PANEL
ALBUMIN: 3.9 g/dL (ref 3.5–5.2)
ALT: 14 U/L (ref 0–35)
AST: 15 U/L (ref 0–37)
Alkaline Phosphatase: 47 U/L (ref 39–117)
Bilirubin, Direct: 0.1 mg/dL (ref 0.0–0.3)
Indirect Bilirubin: 0.3 mg/dL (ref 0.0–0.9)
TOTAL PROTEIN: 6.4 g/dL (ref 6.0–8.3)
Total Bilirubin: 0.4 mg/dL (ref 0.3–1.2)

## 2013-07-12 LAB — LIPID PANEL
CHOL/HDL RATIO: 3.2 ratio
CHOLESTEROL: 187 mg/dL (ref 0–200)
HDL: 59 mg/dL (ref >39)
LDL Cholesterol: 103 mg/dL — ABNORMAL HIGH (ref 0–99)
Triglycerides: 125 mg/dL (ref <150)
VLDL: 25 mg/dL (ref 0–40)

## 2013-07-12 LAB — IRON AND TIBC
%SAT: 35 % (ref 20–55)
Iron: 113 ug/dL (ref 42–145)
TIBC: 327 ug/dL (ref 250–470)
UIBC: 214 ug/dL (ref 125–400)

## 2013-07-12 LAB — CBC WITH DIFFERENTIAL/PLATELET
BASOS PCT: 1 % (ref 0–1)
Basophils Absolute: 0 10*3/uL (ref 0.0–0.1)
EOS ABS: 0.1 10*3/uL (ref 0.0–0.7)
Eosinophils Relative: 1 % (ref 0–5)
HEMATOCRIT: 39.8 % (ref 36.0–46.0)
Hemoglobin: 13.3 g/dL (ref 12.0–15.0)
Lymphocytes Relative: 36 % (ref 12–46)
Lymphs Abs: 1.9 10*3/uL (ref 0.7–4.0)
MCH: 30.7 pg (ref 26.0–34.0)
MCHC: 33.4 g/dL (ref 30.0–36.0)
MCV: 91.9 fL (ref 78.0–100.0)
MONOS PCT: 13 % — AB (ref 3–12)
Monocytes Absolute: 0.7 10*3/uL (ref 0.1–1.0)
NEUTROS PCT: 49 % (ref 43–77)
Neutro Abs: 2.5 10*3/uL (ref 1.7–7.7)
Platelets: 236 10*3/uL (ref 150–400)
RBC: 4.33 MIL/uL (ref 3.87–5.11)
RDW: 13.7 % (ref 11.5–15.5)
WBC: 5.1 10*3/uL (ref 4.0–10.5)

## 2013-07-12 LAB — BASIC METABOLIC PANEL WITH GFR
BUN: 11 mg/dL (ref 6–23)
CO2: 21 mEq/L (ref 19–32)
Calcium: 8.6 mg/dL (ref 8.4–10.5)
Chloride: 108 mEq/L (ref 96–112)
Creat: 0.87 mg/dL (ref 0.50–1.10)
GFR, Est Non African American: 82 mL/min
GLUCOSE: 92 mg/dL (ref 70–99)
POTASSIUM: 3.5 meq/L (ref 3.5–5.3)
Sodium: 138 mEq/L (ref 135–145)

## 2013-07-12 LAB — VITAMIN B12: Vitamin B-12: 357 pg/mL (ref 211–911)

## 2013-07-12 LAB — FOLATE RBC: RBC Folate: 454 ng/mL (ref >280)

## 2013-07-12 LAB — VITAMIN D 25 HYDROXY (VIT D DEFICIENCY, FRACTURES): Vit D, 25-Hydroxy: 32 ng/mL (ref 30–89)

## 2013-07-12 LAB — MAGNESIUM: Magnesium: 2 mg/dL (ref 1.5–2.5)

## 2013-07-12 LAB — TSH: TSH: 1.585 u[IU]/mL (ref 0.350–4.500)

## 2013-07-14 NOTE — Progress Notes (Signed)
Patient ID: Maria Arias, female   DOB: 04/20/1971, 43 y.o.   MRN: [id number] NO visit patient had to leave before exam

## 2013-07-15 ENCOUNTER — Telehealth: Payer: Self-pay | Admitting: *Deleted

## 2013-07-15 NOTE — Telephone Encounter (Signed)
Discussed patient's most recent lab results and instructions.

## 2013-07-15 NOTE — Telephone Encounter (Signed)
Message copied by Laurence SpatesHELMER, Kataleena Holsapple A on Mon Jul 15, 2013 12:53 PM ------      Message from: Beryl JunctionSMITH, [place]ELISSA R      Created: Mon Jul 15, 2013  7:28 AM       VIT D add 5000 IU, magnesium add 250 mg, add super B all are low and may help with energy. Cholesterol still mild elevation, continue to improve diet and cardio. Recheck 3-4 month OV ------

## 2013-07-16 ENCOUNTER — Encounter: Payer: Self-pay | Admitting: Emergency Medicine

## 2013-07-24 ENCOUNTER — Other Ambulatory Visit: Payer: Self-pay | Admitting: Emergency Medicine

## 2013-07-24 MED ORDER — SERTRALINE HCL 100 MG PO TABS: 100.0000 mg | ORAL_TABLET | Freq: Every day | ORAL | Status: DC

## 2013-07-26 ENCOUNTER — Ambulatory Visit (INDEPENDENT_AMBULATORY_CARE_PROVIDER_SITE_OTHER): Payer: BC Managed Care – PPO | Admitting: Gastroenterology

## 2013-07-26 ENCOUNTER — Encounter: Payer: Self-pay | Admitting: Gastroenterology

## 2013-07-26 VITALS — BP 100/70 | HR 64 | Ht 63.5 in | Wt 154.8 lb

## 2013-07-26 DIAGNOSIS — K644 Residual hemorrhoidal skin tags: Secondary | ICD-10-CM

## 2013-07-26 DIAGNOSIS — K602 Anal fissure, unspecified: Secondary | ICD-10-CM

## 2013-07-26 DIAGNOSIS — R197 Diarrhea, unspecified: Secondary | ICD-10-CM

## 2013-07-26 DIAGNOSIS — K648 Other hemorrhoids: Secondary | ICD-10-CM | POA: Insufficient documentation

## 2013-07-26 MED ORDER — AMBULATORY NON FORMULARY MEDICATION: Status: DC

## 2013-07-26 NOTE — Progress Notes (Signed)
_                                                                                                                History of Present Illness: Pleasant 43 year old white female referred for evaluation of rectal bleeding and pain.  She has a history of an anal fissure.  For months she has been complaining of burning with a bowel movement with subsequent aching.  She's had small amounts of blood in the toilet and on the tissue.  She tends to have loose stools 2-3 times a day.  She is also bothered by an external skin tag that causes problems with cleansing.  Pain is bleeding has been an intermittent problem for years.    Past Medical History  Diagnosis Date  . GERD (gastroesophageal reflux disease)   . Hyperlipidemia   . Anxiety   . Depression   . Migraines   . 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 Anal fissure   . Irritable bowel disease    Past Surgical History  Procedure Laterality Date  . Appendectomy    . Knee arthroscopy Right   . Ankle surgery Left    family history includes Depression in her mother; Diabetes in her paternal uncle; Heart disease in her maternal grandfather; Hyperlipidemia in her father; Hypertension in her father. There is no history of Colon cancer or Esophageal cancer. Current Outpatient Prescriptions  Medication Sig Dispense Refill  . etonogestrel-ethinyl estradiol (NUVARING) 0.12-0.015 MG/24HR vaginal ring Insert vaginally and leave in place for 3 consecutive weeks, then remove for 1 week.  1 each  11  . sertraline (ZOLOFT) 100 MG tablet Take 1 tablet (100 mg total) by mouth daily. Take 1 and  A 1/2 pills =150mg  daily  135 tablet  3  . SUMAtriptan (IMITREX) 100 MG tablet Take 100 mg by mouth every 2 (two) hours as needed for migraine or headache. May repeat in 2 hours if headache persists or recurs.      . topiramate (TOPAMAX) 100 MG tablet Take 100 mg by mouth 2 (two) times daily.      . traZODone (DESYREL) 100 MG  tablet Take 1.5 pills QHS  45 tablet  1   No current facility-administered medications for this visit.   Allergies as of 07/26/2013 - Review Complete 07/26/2013  Allergen Reaction Noted  . Ppd [tuberculin purified protein derivative]  07/08/2013    reports that she has never smoked. She does not have any smokeless tobacco history on file. She reports that she drinks alcohol. Her drug history is not on file.     Review of Systems: Pertinent positive and negative review of systems were noted in the above HPI section. All other review of systems were otherwise negative.  Vital signs were reviewed in today's medical record Physical Exam: General: Well developed , well nourished, no acute distress Skin: anicteric Head: Normocephalic and atraumatic Eyes:  sclerae anicteric, EOMI Ears: Normal auditory acuity Mouth: No deformity or  lesions Neck: Supple, no masses or thyromegaly Lungs: Clear throughout to auscultation Heart: Regular rate and rhythm; no murmurs, rubs or bruits Abdomen: Soft, non tender and non distended. No masses, hepatosplenomegaly or hernias noted. Normal Bowel sounds Rectal: Visual examination demonstrates a external skin tag and a fissure at the posterior midline Musculoskeletal: Symmetrical with no gross deformities  Skin: No lesions on visible extremities Pulses:  Normal pulses noted Extremities: No clubbing, cyanosis, edema or deformities noted Neurological: Alert oriented x 4, grossly nonfocal Cervical Nodes:  No significant cervical adenopathy Inguinal Nodes: No significant inguinal adenopathy Psychological:  Alert and cooperative. Normal mood and affect  See Assessment and Plan under Problem List

## 2013-07-26 NOTE — Assessment & Plan Note (Signed)
She is symptomatic with persistent bleeding and itching.  She has tried topicals in the past.  I would consider band ligation at a later date while treating her anal fissure

## 2013-07-26 NOTE — Assessment & Plan Note (Signed)
Rectal bleeding and pain are likely to 2 the anal fissure  Recommendations #1 fiber supplementation daily #2 nitroglycerin ointment

## 2013-07-26 NOTE — Assessment & Plan Note (Signed)
Diarrhea is probably combination of stress and IBS.  Plan fiber supplementation for now.

## 2013-07-26 NOTE — Assessment & Plan Note (Signed)
She wishes to have this removed.  Will refer to Gen. surgery.

## 2013-07-26 NOTE — Patient Instructions (Signed)
You have been scheduled for an appointment with _____ at [facility]. Your appointment is on _____ at ____. Please arrive at _____ for registration. Make certain to bring a list of current medications, including any over the counter medications or vitamins. Also bring your co-pay if you have one as well as your insurance cards. [facility] is located at [address], [address]. Should you need to reschedule your appointment, please contact them at [phone].   We are giving you a printed prescription of Nitroglycerin ointment [facility] to fill Follow up in 6 weeks   Use fiber daily  High-Fiber Diet Fiber is found in fruits, vegetables, and grains. A high-fiber diet encourages the addition of more whole grains, legumes, fruits, and vegetables in your diet. The recommended amount of fiber for adult males is 38 g per day. For adult females, it is 25 g per day. Pregnant and lactating women should get 28 g of fiber per day. If you have a digestive or bowel problem, ask your caregiver for advice before adding high-fiber foods to your diet. Eat a variety of high-fiber foods instead of only a select few type of foods.  PURPOSE  To increase stool bulk.  To make bowel movements more regular to prevent constipation.  To lower cholesterol.  To prevent overeating. WHEN IS THIS DIET USED?  It may be used if you have constipation and hemorrhoids.  It may be used if you have uncomplicated diverticulosis (intestine condition) and irritable bowel syndrome.  It may be used if you need help with weight management.  It may be used if you want to add it to your diet as a protective measure against atherosclerosis, diabetes, and cancer. SOURCES OF FIBER  Whole-grain breads and cereals.  Fruits, such as apples, oranges, bananas, berries, prunes, and pears.  Vegetables, such as green peas, carrots, sweet potatoes, beets, broccoli, cabbage,  spinach, and artichokes.  Legumes, such split peas, soy, lentils.  Almonds. FIBER CONTENT IN FOODS Starches and Grains / Dietary Fiber (g)  Cheerios, 1 cup / 3 g  Corn Flakes cereal, 1 cup / 0.7 g  Rice crispy treat cereal, 1 cup / 0.3 g  Instant oatmeal (cooked),  cup / 2 g  Frosted wheat cereal, 1 cup / 5.1 g  Brown, long-grain rice (cooked), 1 cup / 3.5 g  White, long-grain rice (cooked), 1 cup / 0.6 g  Enriched macaroni (cooked), 1 cup / 2.5 g Legumes / Dietary Fiber (g)  Baked beans (canned, plain, or vegetarian),  cup / 5.2 g  Kidney beans (canned),  cup / 6.8 g  Pinto beans (cooked),  cup / 5.5 g Breads and Crackers / Dietary Fiber (g)  Plain or honey graham crackers, 2 squares / 0.7 g  Saltine crackers, 3 squares / 0.3 g  Plain, salted pretzels, 10 pieces / 1.8 g  Whole-wheat bread, 1 slice / 1.9 g  White bread, 1 slice / 0.7 g  Raisin bread, 1 slice / 1.2 g  Plain bagel, 3 oz / 2 g  Flour tortilla, 1 oz / 0.9 g  Corn tortilla, 1 small / 1.5 g  Hamburger or hotdog bun, 1 small / 0.9 g Fruits / Dietary Fiber (g)  Apple with skin, 1 medium / 4.4 g  Sweetened applesauce,  cup / 1.5 g  Banana,  medium / 1.5 g  Grapes, 10 grapes / 0.4 g  Orange, 1 small / 2.3 g  Raisin,  1.5 oz / 1.6 g  Melon, 1 cup / 1.4 g Vegetables / Dietary Fiber (g)  Green beans (canned),  cup / 1.3 g  Carrots (cooked),  cup / 2.3 g  Broccoli (cooked),  cup / 2.8 g  Peas (cooked),  cup / 4.4 g  Mashed potatoes,  cup / 1.6 g  Lettuce, 1 cup / 0.5 g  Corn (canned),  cup / 1.6 g  Tomato,  cup / 1.1 g Document Released: 06/13/2005 Document Revised: 12/13/2011 Document Reviewed: 09/15/2011 [facility] Patient Information 2014 [place], [place].

## 2013-08-08 ENCOUNTER — Encounter: Payer: Self-pay | Admitting: Emergency Medicine

## 2013-08-08 ENCOUNTER — Ambulatory Visit (INDEPENDENT_AMBULATORY_CARE_PROVIDER_SITE_OTHER): Payer: BC Managed Care – PPO | Admitting: Emergency Medicine

## 2013-08-08 VITALS — BP 108/78 | HR 62 | Temp 98.4°F | Resp 18 | Ht 64.0 in | Wt 155.0 lb

## 2013-08-08 DIAGNOSIS — K602 Anal fissure, unspecified: Secondary | ICD-10-CM

## 2013-08-08 DIAGNOSIS — Z0000 Encounter for general adult medical examination without abnormal findings: Secondary | ICD-10-CM

## 2013-08-08 DIAGNOSIS — E559 Vitamin D deficiency, unspecified: Secondary | ICD-10-CM

## 2013-08-08 DIAGNOSIS — Z113 Encounter for screening for infections with a predominantly sexual mode of transmission: Secondary | ICD-10-CM

## 2013-08-08 DIAGNOSIS — Z79899 Other long term (current) drug therapy: Secondary | ICD-10-CM

## 2013-08-08 LAB — CBC WITH DIFFERENTIAL/PLATELET
BASOS ABS: 0 10*3/uL (ref 0.0–0.1)
BASOS PCT: 0 % (ref 0–1)
EOS ABS: 0 10*3/uL (ref 0.0–0.7)
Eosinophils Relative: 1 % (ref 0–5)
HEMATOCRIT: 38.7 % (ref 36.0–46.0)
Hemoglobin: 13.4 g/dL (ref 12.0–15.0)
Lymphocytes Relative: 38 % (ref 12–46)
Lymphs Abs: 2.3 10*3/uL (ref 0.7–4.0)
MCH: 31.2 pg (ref 26.0–34.0)
MCHC: 34.6 g/dL (ref 30.0–36.0)
MCV: 90.2 fL (ref 78.0–100.0)
MONO ABS: 0.5 10*3/uL (ref 0.1–1.0)
MONOS PCT: 9 % (ref 3–12)
Neutro Abs: 3.2 10*3/uL (ref 1.7–7.7)
Neutrophils Relative %: 52 % (ref 43–77)
Platelets: 270 10*3/uL (ref 150–400)
RBC: 4.29 MIL/uL (ref 3.87–5.11)
RDW: 13.7 % (ref 11.5–15.5)
WBC: 6 10*3/uL (ref 4.0–10.5)

## 2013-08-08 LAB — HEMOGLOBIN A1C
Hgb A1c MFr Bld: 5.1 % (ref <5.7)
MEAN PLASMA GLUCOSE: 100 mg/dL (ref <117)

## 2013-08-08 LAB — HIV ANTIBODY (ROUTINE TESTING W REFLEX): HIV: NONREACTIVE

## 2013-08-08 LAB — HEPATITIS PANEL, ACUTE
HCV AB: NEGATIVE
HEP B S AG: NEGATIVE
Hep A IgM: NONREACTIVE
Hep B C IgM: NONREACTIVE

## 2013-08-08 LAB — RPR

## 2013-08-08 NOTE — Patient Instructions (Signed)
Anal Fissure, Adult  An anal fissure is a small tear or crack in the skin around the opening of the butt (anus). Bleeding from the tear or crack usually stops on its own within a few minutes. The bleeding may happen every time you poop until the tear or crack heals.   HOME CARE  · Eat lots of fruit, whole grains, and vegetables. Avoid foods like bananas and dairy products. These foods can make it hard to poop.  · Take a warm water bath (sitz bath) as told by your doctor.  · Drink enough fluids to keep your pee (urine) clear or pale yellow.  · Only take medicines as told by your doctor. Do not take aspirin.  · Do not use numbing creams or hydrocortisone cream on the area. These creams can slow healing.  GET HELP RIGHT AWAY IF:  · Your tear or crack is not healed in 3 days.  · You have more bleeding.  · You have a fever.  · You have watery poop (diarrhea) mixed with blood.  · You have pain.  · You are getting worse, not better.  MAKE SURE YOU:   · Understand these instructions.  · Will watch your condition.  · Will get help right away if you are not doing well or get worse.  Document Released: 02/09/2011 Document Revised: 09/05/2011 Document Reviewed: 02/09/2011  ExitCare® Patient Information ©2014 [employer].

## 2013-08-08 NOTE — Progress Notes (Addendum)
Subjective:    Patient ID: Maria Arias, female    DOB: [birth year], 43 y.o.   MRN: [id number]  HPI Comments: 43 yo pleasant female CPE. She has not added vitamins AD on 07/11/13 visit. She has been doing well overall. She notes she is continuing to stay active and improve diet. She is currently managing stress better with separation from husband. She takes anxiety/ depression meds AD. She has been using the NUVA RING AD but notes is has decreased libido and is considering IUD. She denies any new sexual partners or STD concerns but wants to have screening performed. 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 She denies any current TB symptoms. She was unable to finish Isoniazid treatment due to rash and would like to try Rifampin treatment since she is off GERD RX.   She has been recently evaluated by Dr Arlyce Dice for rectal bleeding and DX of anal fissure/ hemorrhoids was given. She has had improvement with symptoms with NTG cream but has f/u pending for recheck and hemorrhoid banding.      Medication List       This list is accurate as of: 08/08/13 11:59 PM.  Always use your most recent med list.               AMBULATORY NON FORMULARY MEDICATION  Medication Name: Nitroglycerin ointment 0.125% use as needed rectally four times a day     etonogestrel-ethinyl estradiol 0.12-0.015 MG/24HR vaginal ring  Commonly known as:  NUVARING  Insert vaginally and leave in place for 3 consecutive weeks, then remove for 1 week.     sertraline 100 MG tablet  Commonly known as:  ZOLOFT  Take 1 tablet (100 mg total) by mouth daily. Take 1 and  A 1/2 pills =150mg  daily     SUMAtriptan 100 MG tablet  Commonly known as:  IMITREX  Take 100 mg by mouth every 2 (two) hours as needed for migraine or headache. May repeat in 2 hours if headache persists or recurs.     topiramate 100 MG tablet  Commonly known as:  TOPAMAX  Take 100 mg by mouth 2  (two) times daily.     traZODone 100 MG tablet  Commonly known as:  DESYREL  Takes 1/2 pill QHS       Allergies  Allergen Reactions  . Ppd [Tuberculin Purified Protein Derivative]     +ppd with treatment 08/08/12   Past Medical History  Diagnosis Date  . GERD (gastroesophageal reflux disease)   . Hyperlipidemia   . Anxiety   . Depression   . Migraines   . 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 Anal fissure   . Irritable bowel disease    Past Surgical History  Procedure Laterality Date  . Appendectomy    . Knee arthroscopy Right   . Ankle surgery Left    History  Substance Use Topics  . Smoking status: Never Smoker   . Smokeless tobacco: Not on file  . Alcohol Use: Yes     Comment: socially   Family History  Problem Relation Age of Onset  . Depression Mother   . Hypertension Father   . Hyperlipidemia Father   . Colon cancer Neg Hx   . Esophageal cancer Neg Hx   . Heart disease Maternal Grandfather   . Diabetes Paternal Uncle  Review of Systems  All other systems reviewed and are negative.   BP 108/78  Pulse 62  Temp(Src) 98.4 F (36.9 C) (Temporal)  Resp 18  Ht 5\' 4"  (1.626 m)  Wt 155 lb (70.308 kg)  BMI 26.59 kg/m2  LMP 07/14/2013     Objective:   Physical Exam  Nursing note and vitals reviewed. Constitutional: She is oriented to person, place, and time. She appears well-developed and well-nourished. No distress.  HENT:  Head: Normocephalic and atraumatic.  Right Ear: External ear normal.  Left Ear: External ear normal.  Nose: Nose normal.  Mouth/Throat: Oropharynx is clear and moist. No oropharyngeal exudate.  Eyes: Conjunctivae and EOM are normal. Pupils are equal, round, and reactive to light. Right eye exhibits no discharge. Left eye exhibits no discharge. No scleral icterus.  Neck: Normal range of motion. Neck supple. No JVD present. No tracheal deviation present. No thyromegaly present.  Cardiovascular: Normal  rate, regular rhythm, normal heart sounds and intact distal pulses.   Pulmonary/Chest: Effort normal and breath sounds normal.  Abdominal: Soft. Bowel sounds are normal. She exhibits no distension and no mass. There is no tenderness. There is no rebound and no guarding.  Genitourinary:  Manual exam only WNk, Nuva Ring in place Breasts Fibrocystic  Musculoskeletal: Normal range of motion. She exhibits no edema and no tenderness.  Lymphadenopathy:    She has no cervical adenopathy.  Neurological: She is alert and oriented to person, place, and time. She has normal reflexes. No cranial nerve deficit. She exhibits normal muscle tone. Coordination normal.  Skin: Skin is warm and dry. No rash noted. No erythema. No pallor.  Psychiatric: She has a normal mood and affect. Her behavior is normal. Judgment and thought content normal.      EKG WNL NSCSPT    Assessment & Plan:  1.CPE- Update screening labs/ History/ Immunizations/ Testing as needed. Advised healthy diet, QD exercise, increase H20 and continue RX/ Vitamins AD. Repeat CXR for screening. Mammo due 09/01/13 2. TB + with incomplete treatment due to rash with Isoniazid, repeat cxr and wants to start rifampin. Will need to repeat CBC/ CMEt 1 month lab only and use alternate BC. 3. ANAL fissure continue PLAN AD per GI, w/c if SX increase or ER. 4.Mild cholesterol elevation will recheck 3 months with recent lab 07/11/13 5. VIT DEF on 07/11/13 labs- ADD ALL VITAMINS AD 6. Std screening- Per patient request/ DECREASED libido- She declines pap, advised if wants IUD will need GYN eval with PAP

## 2013-08-09 LAB — URINALYSIS, ROUTINE W REFLEX MICROSCOPIC
Bilirubin Urine: NEGATIVE
GLUCOSE, UA: NEGATIVE mg/dL
Hgb urine dipstick: NEGATIVE
KETONES UR: NEGATIVE mg/dL
Nitrite: NEGATIVE
PROTEIN: NEGATIVE mg/dL
Specific Gravity, Urine: 1.021 (ref 1.005–1.030)
Urobilinogen, UA: 0.2 mg/dL (ref 0.0–1.0)
pH: 5.5 (ref 5.0–8.0)

## 2013-08-09 LAB — URINALYSIS, MICROSCOPIC ONLY
BACTERIA UA: NONE SEEN
CASTS: NONE SEEN
CRYSTALS: NONE SEEN
Squamous Epithelial / LPF: NONE SEEN

## 2013-08-09 LAB — MICROALBUMIN / CREATININE URINE RATIO
Creatinine, Urine: 189.8 mg/dL
MICROALB/CREAT RATIO: 3.2 mg/g (ref 0.0–30.0)
Microalb, Ur: 0.6 mg/dL (ref 0.00–1.89)

## 2013-08-09 LAB — INSULIN, FASTING: Insulin fasting, serum: 14 u[IU]/mL (ref 3–28)

## 2013-08-09 LAB — GC PROBE AMPLIFICATION, URINE: GC PROBE AMP, URINE: NEGATIVE

## 2013-08-12 ENCOUNTER — Other Ambulatory Visit: Payer: Self-pay | Admitting: Emergency Medicine

## 2013-08-12 DIAGNOSIS — R899 Unspecified abnormal finding in specimens from other organs, systems and tissues: Secondary | ICD-10-CM

## 2013-08-12 LAB — HSV(HERPES SIMPLEX VRS) I + II AB-IGG
HSV 1 Glycoprotein G Ab, IgG: 3.5 IV — ABNORMAL HIGH
HSV 2 Glycoprotein G Ab, IgG: <0.1 IV

## 2013-08-12 MED ORDER — RIFAMPIN 300 MG PO CAPS: 300.0000 mg | ORAL_CAPSULE | Freq: Every day | ORAL | Status: DC

## 2013-08-16 ENCOUNTER — Ambulatory Visit
Admission: RE | Admit: 2013-08-16 | Discharge: 2013-08-16 | Disposition: A | Discharge status: Home / Self Care | Payer: BC Managed Care – PPO | Source: Ambulatory Visit | Attending: Emergency Medicine | Admitting: Emergency Medicine

## 2013-08-16 DIAGNOSIS — R899 Unspecified abnormal finding in specimens from other organs, systems and tissues: Secondary | ICD-10-CM

## 2013-08-16 IMAGING — CR DG CHEST 2V
2 series · 2 of 2 positions shown · non-contrast
Comparison: Chest x-ray of [DATE]

CLINICAL DATA: History of positive PPD, followup

EXAM:
CHEST  2 VIEW

[w chest pa]
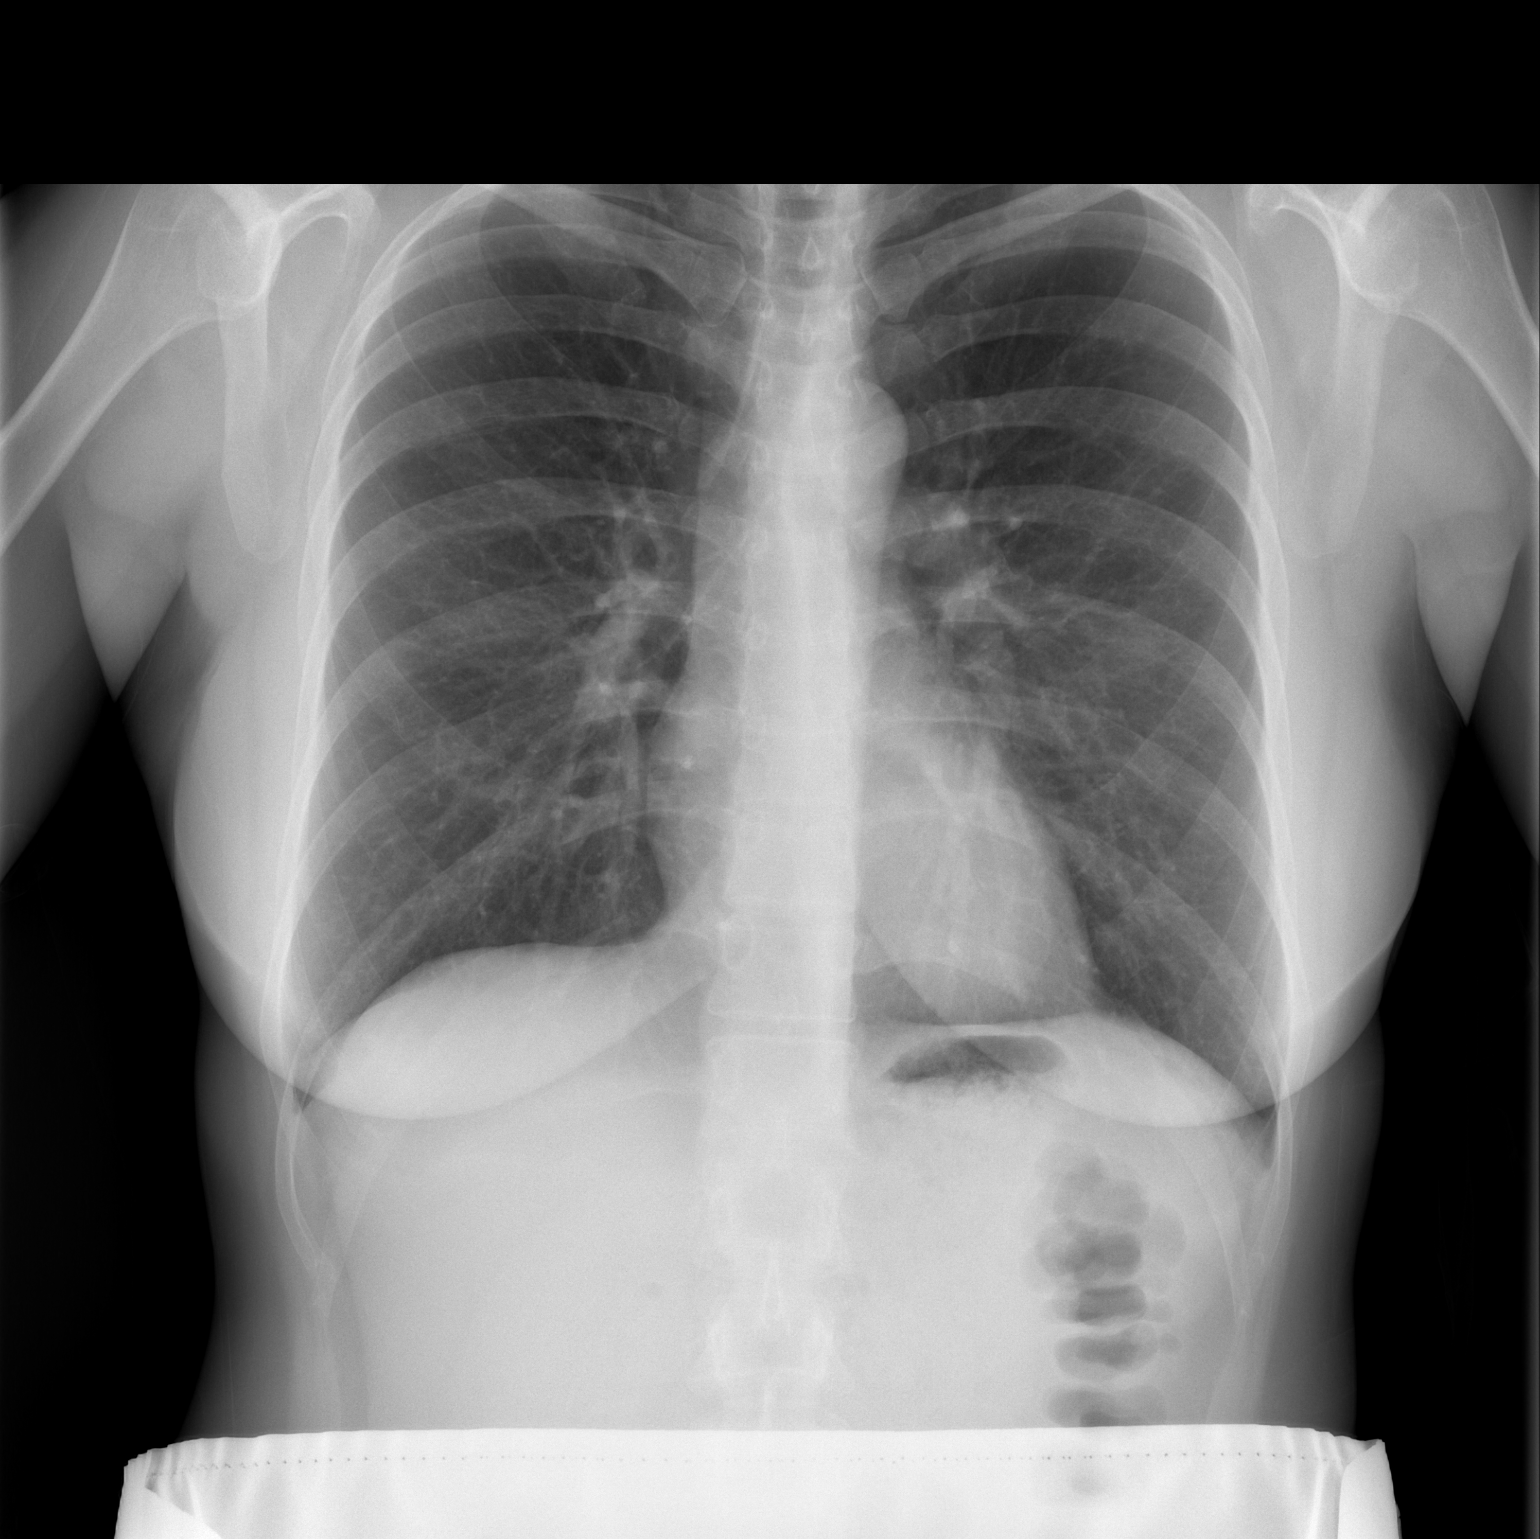

[w chest lat]
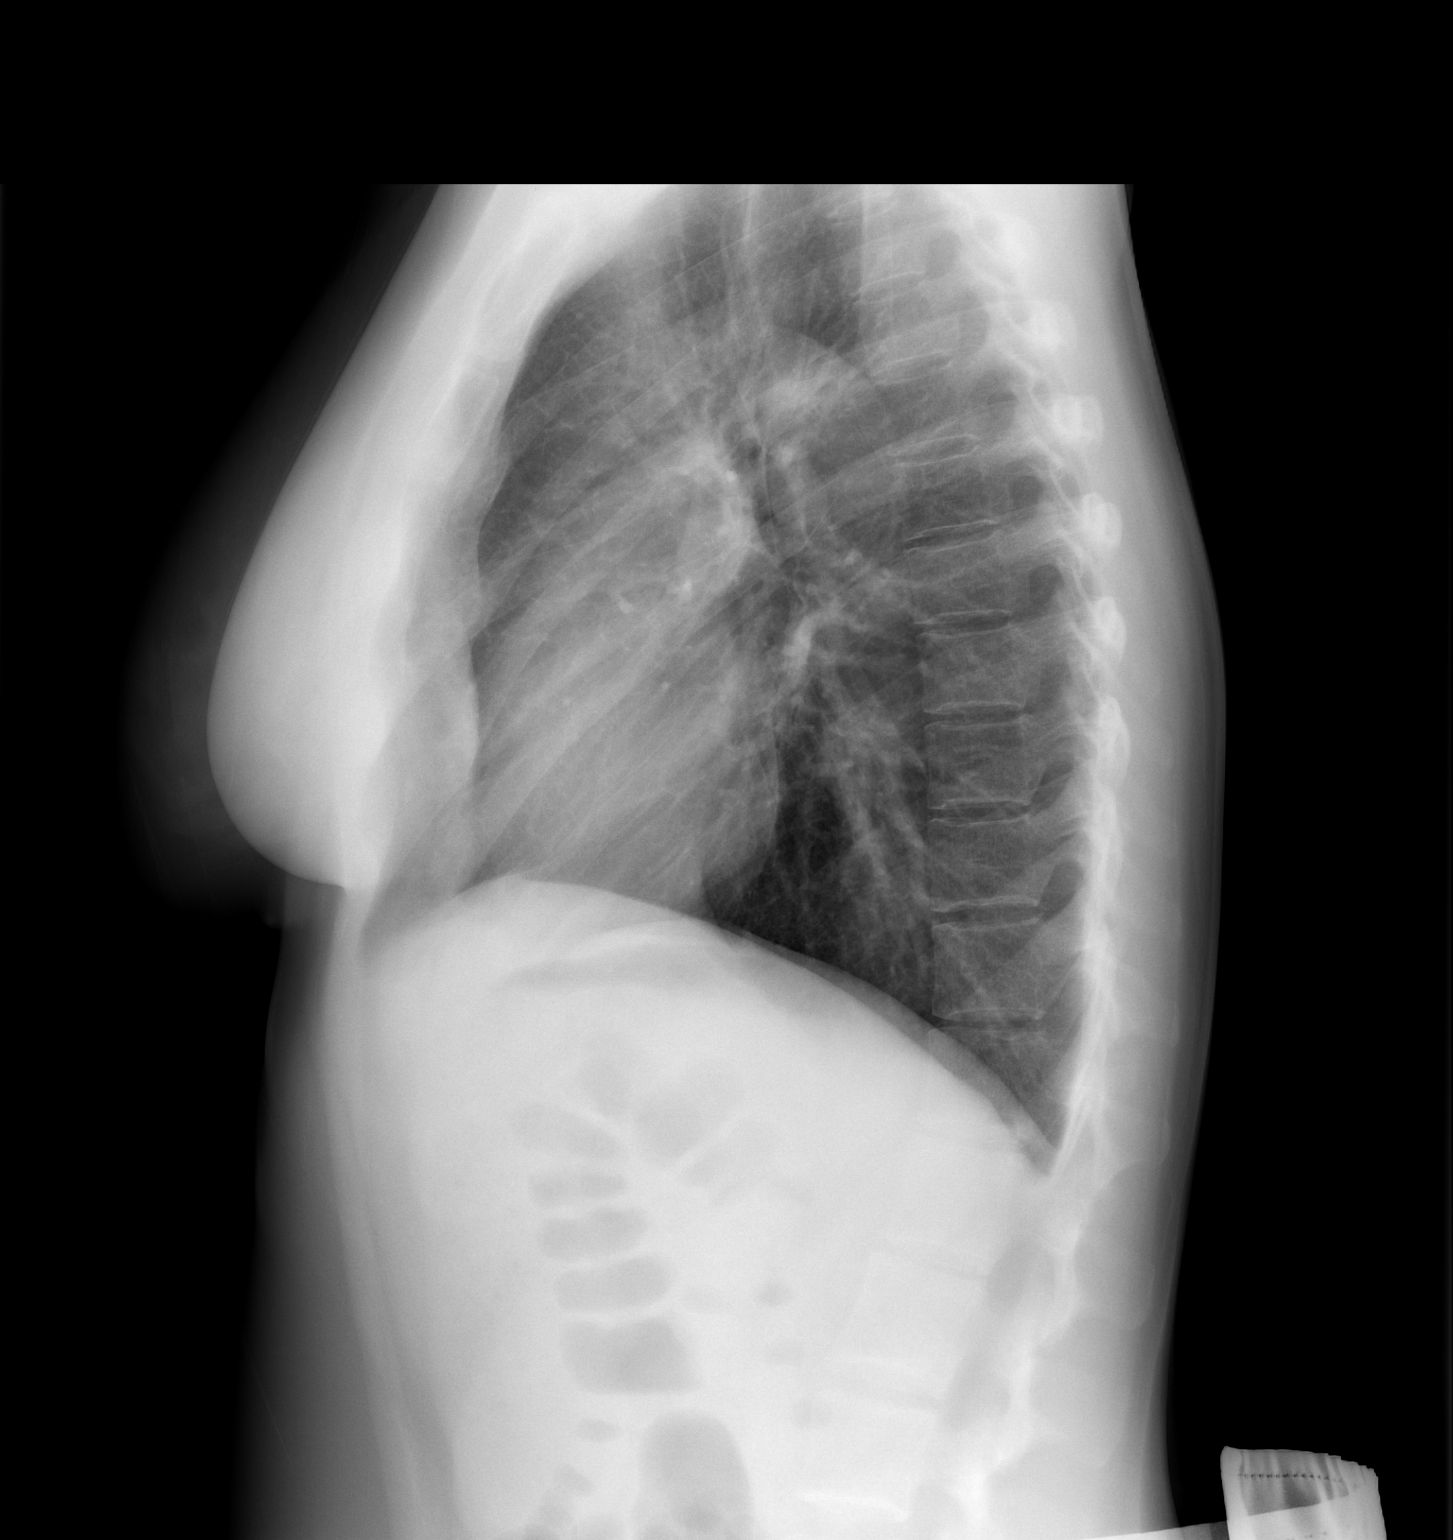

[2 of 2 positions shown; findings below may reference images not displayed]

FINDINGS: No active infiltrate or effusion is seen. No sequela of prior
tuberculous infection is seen. Mediastinal contours are stable. The
heart is within normal limits in size. No bony abnormality is noted.
IMPRESSION: Stable chest x-ray.  No active lung disease.

## 2013-08-29 ENCOUNTER — Ambulatory Visit (INDEPENDENT_AMBULATORY_CARE_PROVIDER_SITE_OTHER): Payer: BC Managed Care – PPO | Admitting: Surgery

## 2013-08-29 ENCOUNTER — Encounter (INDEPENDENT_AMBULATORY_CARE_PROVIDER_SITE_OTHER): Payer: Self-pay | Admitting: Surgery

## 2013-08-29 VITALS — Temp 99.1°F | Ht 63.5 in | Wt 155.6 lb

## 2013-08-29 DIAGNOSIS — K644 Residual hemorrhoidal skin tags: Secondary | ICD-10-CM

## 2013-08-29 NOTE — Progress Notes (Signed)
Re:   JETTY BERLAND DOB:   30-Jun-1970 MRN:   [id number]  ASSESSMENT AND PLAN: 1.  Anterior anal tag  Probably old external hemorrhoid that has scarred down.  She wants this excised.  We're going to give her a few more months to see how the anal fissure does before addressing this.  I gave her literature on hemorrhoids and fissures.  She will see me back in 6 weeks.  2.  Posterior anal fissure with sentinel tag  She is already improving on the treatment plan that Dr. Arlyce Dice has started.  My assumption is that this will heal medically.  But I did discuss surgical options for anal fissures -> lateral internal sphincterotomy.  But I told her surgery was a last resort and only for failed medical therapy.  3.  Loose stools/IBS  Controlling this will help with the fissure. 4.  History of anxiety/depression 5.  GERD 6.  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  She is just to start Rifampin. 7.  Migraine headaches  Better with divorce and on Topamax  Chief Complaint  Patient presents with  . New Evaluation    eval skin tags   REFERRING PHYSICIAN: Heide Scales, FNP  HISTORY OF PRESENT ILLNESS: EUTHA CUDE is a 43 y.o. (DOB: 05/13/1971) white  female whose primary care physician is Heide Scales, FNP and comes to me today for anal skin tags.  The patient has had a prior anal fissure that is healed. Then about a month ago, she developed another anal fissure. She has loose stools daily, about once or twice a day. A lot of this is been attributed to her divorce and problems with her former husband.  She is seeing Dr. Arlyce Dice.  She is having pain with bowel movements, loose stools 1 or 2 times per day, and has an external anal  skin tag that bothers her.  She is on anal nitroglycerin for her fissure and this seems to be helping.  The bleeding is also getting better.  Past Medical History  Diagnosis Date  . GERD  (gastroesophageal reflux disease)   . Hyperlipidemia   . Anxiety   . Depression   . Migraines   . 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 Anal fissure   . Irritable bowel disease   . Arthritis     Past Surgical History  Procedure Laterality Date  . Appendectomy    . Knee arthroscopy Right   . Ankle surgery Left     Current Outpatient Prescriptions  Medication Sig Dispense Refill  . AMBULATORY NON FORMULARY MEDICATION Medication Name: Nitroglycerin ointment 0.125% use as needed rectally four times a day  30 g  1  . etonogestrel-ethinyl estradiol (NUVARING) 0.12-0.015 MG/24HR vaginal ring Insert vaginally and leave in place for 3 consecutive weeks, then remove for 1 week.  1 each  11  . sertraline (ZOLOFT) 100 MG tablet Take 1 tablet (100 mg total) by mouth daily. Take 1 and  A 1/2 pills =150mg  daily  135 tablet  3  . topiramate (TOPAMAX) 100 MG tablet Take 100 mg by mouth 2 (two) times daily.      . traZODone (DESYREL) 100 MG tablet Takes 1/2 pill QHS      . rifampin (RIFADIN) 300 MG capsule Take 1 capsule (300 mg total) by mouth daily.  30 capsule  3  No current facility-administered medications for this visit.      Allergies  Allergen Reactions  . Ppd [Tuberculin Purified Protein Derivative]     +ppd with treatment 08/08/12    REVIEW OF SYSTEMS: Skin:  No history of rash.  No history of abnormal moles. Infection:  Being treated for TB.  Source unclear.  Tried Isoniazid, but could not tolerate it.  Now starting Rifampin. Neurologic:  Migraine headaches.  Got a bunch of injections to scalp.  Now on Topamax.  Se[facility]. Cardiac:  No history of hypertension. No history of heart disease.  No history of prior cardiac catheterization.  No history of seeing a cardiologist. Pulmonary:  Does not smoke cigarettes.  No asthma or bronchitis.  No OSA/CPAP.  Endocrine:  No diabetes. No thyroid disease. Gastrointestinal:  No  history of stomach disease.  No history of liver disease.  No history of gall bladder disease.  No history of pancreas disease.  Had appendectomy, open, 1985. Urologic:  No history of kidney stones.  No history of bladder infections. Musculoskeletal:  No history of joint or back disease. Hematologic:  No bleeding disorder.  No history of anemia.  Not anticoagulated. Psycho-social:  The patient is oriented.   The patient has no obvious psychologic or social impairment to understanding our conversation and plan.  SOCIAL and FAMILY HISTORY: Recently divorced.  Sounds like she has had a lot of issues related to the anxiety around her divorce. Works as Landscape architectforensic accountant Has one daughter, age 427  PHYSICAL EXAM: Temp(Src) 99.1 F (37.3 C) (Oral)  Ht 5' 3.5" (1.613 m)  Wt 155 lb 9.6 oz (70.58 kg)  BMI 27.13 kg/m2  LMP 08/10/2013  General: WN WF who is alert and generally healthy appearing.  HEENT: Normal. Pupils equal. Neck: Supple. No mass.   Abdomen: Soft. No mass. No tenderness. No hernia. Normal bowel sounds.  Lower abdominal scar, transverse. Rectal: Posterior anal fissure with sentinel skin tags.  Right anterior external hemorrhoid skin tag approx 2 x 2 cm. Extremities:  Good strength and ROM  in upper and lower extremities. Neurologic:  Grossly intact to motor and sensory function. Psychiatric: Has normal mood and affect. Behavior is normal.      (<--- towards head)           Left anterior anal skin tag  DATA REVIEWED: Epic notes  Ovidio Kinavid Terrisha Lopata, MD,  [facility], [place] [address]  [address]   [place], [place]    [id number] Phone:  [phone] FAX:  [phone]

## 2013-09-02 ENCOUNTER — Other Ambulatory Visit: Payer: Self-pay | Admitting: Emergency Medicine

## 2013-09-06 ENCOUNTER — Encounter: Payer: Self-pay | Admitting: Gastroenterology

## 2013-09-06 ENCOUNTER — Ambulatory Visit (INDEPENDENT_AMBULATORY_CARE_PROVIDER_SITE_OTHER): Payer: BC Managed Care – PPO | Admitting: Gastroenterology

## 2013-09-06 ENCOUNTER — Other Ambulatory Visit: Payer: BC Managed Care – PPO

## 2013-09-06 VITALS — BP 94/60 | HR 76 | Ht 63.5 in | Wt 152.2 lb

## 2013-09-06 DIAGNOSIS — K602 Anal fissure, unspecified: Secondary | ICD-10-CM

## 2013-09-06 DIAGNOSIS — R197 Diarrhea, unspecified: Secondary | ICD-10-CM

## 2013-09-06 NOTE — Assessment & Plan Note (Signed)
While this could be stress-related enteric infection and inflammatory changes should be ruled out.  Recommendations #1 check stool pathogen panel and lactoferrin #2 colonoscopy if #1 is negative

## 2013-09-06 NOTE — Assessment & Plan Note (Signed)
Clinically improving as evidenced by decrease pain and bleeding.  Plan to continue nitroglycerin ointment.

## 2013-09-06 NOTE — Patient Instructions (Signed)
Go to the basement for your lab kit today 

## 2013-09-06 NOTE — Progress Notes (Signed)
          History of Present Illness:  [name] has returned for followup of anal fissure, hemorrhoids and diarrhea.  The fissure is improved on medical therapy including nitroglycerin.  She has less pain and no further bleeding.  She has been seen by Dr. Ezzard StandingNewman from surgery who deferred removal of the anal tag until the fissure has entirely healed.  Her main complaint is diarrhea.  She has one to 2 loose stools daily.  This began when she separated from her husband 8 months ago.  Although she does not feel stressed any longer symptoms have continued.  She does not attribute this to any medications or foods.    Review of Systems: Pertinent positive and negative review of systems were noted in the above HPI section. All other review of systems were otherwise negative.    Current Medications, Allergies, Past Medical History, Past Surgical History, Family History and Social History were reviewed in [facility] electronic medical record  Vital signs were reviewed in today's medical record. Physical Exam: General: Well developed , well nourished, no acute distress Skin: anicteric   See Assessment and Plan under Problem List

## 2013-10-15 ENCOUNTER — Telehealth: Payer: Self-pay | Admitting: *Deleted

## 2013-10-15 NOTE — Telephone Encounter (Signed)
Please advise her nuva ring usually works better to decrease bleeding but we can switch to a pill or refer her to gyn for further eval.

## 2013-10-15 NOTE — Telephone Encounter (Signed)
nuva ring = seems to not be working says that she continues to have cycle even while the ring is in. Asking should she switch to a pill    [facility] [address]

## 2013-10-16 ENCOUNTER — Encounter (INDEPENDENT_AMBULATORY_CARE_PROVIDER_SITE_OTHER): Payer: Self-pay | Admitting: Surgery

## 2013-10-16 ENCOUNTER — Ambulatory Visit (INDEPENDENT_AMBULATORY_CARE_PROVIDER_SITE_OTHER): Payer: BC Managed Care – PPO | Admitting: Surgery

## 2013-10-16 VITALS — BP 126/76 | HR 71 | Temp 97.1°F | Resp 14 | Ht 63.0 in | Wt 151.6 lb

## 2013-10-16 DIAGNOSIS — K644 Residual hemorrhoidal skin tags: Secondary | ICD-10-CM

## 2013-10-16 NOTE — Telephone Encounter (Signed)
Patient will call Dr. Seymour BarsLavoie to schedule Gyn f/u for further evaluation.

## 2013-10-16 NOTE — Progress Notes (Signed)
Left prior to being seen. 

## 2013-10-22 ENCOUNTER — Ambulatory Visit: Payer: Self-pay | Admitting: Emergency Medicine

## 2013-10-23 ENCOUNTER — Ambulatory Visit (INDEPENDENT_AMBULATORY_CARE_PROVIDER_SITE_OTHER): Payer: BC Managed Care – PPO | Admitting: Surgery

## 2013-10-23 ENCOUNTER — Encounter (INDEPENDENT_AMBULATORY_CARE_PROVIDER_SITE_OTHER): Payer: Self-pay | Admitting: Surgery

## 2013-10-23 VITALS — BP 122/72 | HR 83 | Temp 98.0°F | Resp 19 | Ht 67.0 in | Wt 152.0 lb

## 2013-10-23 DIAGNOSIS — K644 Residual hemorrhoidal skin tags: Secondary | ICD-10-CM

## 2013-10-23 DIAGNOSIS — K602 Anal fissure, unspecified: Secondary | ICD-10-CM

## 2013-10-23 NOTE — Patient Instructions (Addendum)
For surgery:  1  Take clear liquids the day before surgery.    2.  Give yourself a Fleets enema about the night before and about 4 hours before surgery

## 2013-10-23 NOTE — Progress Notes (Signed)
Re:   Maria Arias DOB:   February 16, 1971 MRN:   [id number]  ASSESSMENT AND PLAN: 1.  Right anterior anal tag  Probably old external hemorrhoid that has scarred down.  I again gave her literature on hemorrhoids and fissures (again).  Plan:  1) We will schedule surgery for fissure excision and EUA, 2) She will do a limited bowel prep with clear liquids and fleets enema x 2, 3) I have already givern her a prescription for Vicodin (5/325), #20.  2.  Posterior anal fissure with sentinel tag  She still has a fissurre, but no pain.  I talked about doing a rectal dilatation at the same time as the excision of the anal skin tag.  3.  Loose stools/IBS  Controlling this will help with the fissure. 4.  History of anxiety/depression 5.  GERD 6.  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 7.  Migraine headaches  Better with divorce and on Topamax  Chief Complaint  Patient presents with  . Routine Post Op    anal tag   REFERRING PHYSICIAN: Heide ScalesMelissa H Smith, FNP  HISTORY OF PRESENT ILLNESS: Maria Arias is a 43 y.o. (DOB: February 16, 1971) white  female whose primary care physician is Heide ScalesMelissa H Smith, FNP and comes to me today for anal skin tags. She comes by herself.  Her rectal pain fromt he fissure is gone.  She does have IBS and the frequent stools do irritate her anal skin tag.  She wants to get the skin tag removed.  I talked about doing this under local, but I think with the size of the tag and her fissure, she will be better under general anesthesia.  I also talked about post op recovery, which is primarily doing sitz baths.  History of rectal problems: The patient has had a prior anal fissure that is healed. Then about a month ago, she developed another anal fissure. She has loose stools daily, about once or twice a day. A lot of this is been attributed to her divorce and problems with her former husband.  She is seeing Dr. Arlyce DiceKaplan.  She is having pain  with bowel movements, loose stools 1 or 2 times per day, and has an external anal  skin tag that bothers her.  She is on anal nitroglycerin for her fissure and this seems to be helping.  The bleeding is also getting better.  Past Medical History  Diagnosis Date  . GERD (gastroesophageal reflux disease)   . Hyperlipidemia   . Anxiety   . Depression   . Migraines   . 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 Anal fissure   . Irritable bowel disease   . Arthritis     Past Surgical History  Procedure Laterality Date  . Appendectomy    . Knee arthroscopy Right   . Ankle surgery Left     Current Outpatient Prescriptions  Medication Sig Dispense Refill  . AMBULATORY NON FORMULARY MEDICATION Medication Name: Nitroglycerin ointment 0.125% use as needed rectally four times a day  30 g  1  . etonogestrel-ethinyl estradiol (NUVARING) 0.12-0.015 MG/24HR vaginal ring Insert vaginally and leave in place for 3 consecutive weeks, then remove for 1 week.  1 each  11  . rifampin (RIFADIN) 300 MG capsule Take 1 capsule (300 mg total) by mouth daily.  30 capsule  3  . sertraline (ZOLOFT) 100 MG tablet  Take 150 mg by mouth daily.      Marland Kitchen. topiramate (TOPAMAX) 100 MG tablet Take 100 mg by mouth 2 (two) times daily.      . traZODone (DESYREL) 100 MG tablet TAKE 1/2 TABLETS AT BEDTIME       No current facility-administered medications for this visit.      Allergies  Allergen Reactions  . Ppd [Tuberculin Purified Protein Derivative]     +ppd with treatment 08/08/12    REVIEW OF SYSTEMS: Skin:  No history of rash.  No history of abnormal moles. Infection:  Being treated for TB.  Source unclear.  Tried Isoniazid, but could not tolerate it.  Now starting Rifampin. Neurologic:  Migraine headaches.  Got a bunch of injections to scalp.  Now on Topamax.  [facility]. Gastrointestinal:  No history of stomach disease.  No history of liver disease.  No  history of gall bladder disease.  No history of pancreas disease.  Had appendectomy, open, 1985.  SOCIAL and FAMILY HISTORY: Recently divorced.  Sounds like she has had a lot of issues related to the anxiety around her divorce. Works as Landscape architectforensic accountant Has one daughter, age 607  PHYSICAL EXAM: BP 122/72  Pulse 83  Temp(Src) 98 F (36.7 C)  Resp 19  Ht 5\' 7"  (1.702 m)  Wt 152 lb (68.947 kg)  BMI 23.80 kg/m2  General: WN WF who is alert and generally healthy appearing.  HEENT: Normal. Pupils equal. Neck: Supple. No mass.   Abdomen: Soft. No mass. No tenderness. No hernia. Normal bowel sounds.  Lower abdominal scar, transverse. Rectal: Posterior anal fissure with sentinel skin tags.  Right anterior external hemorrhoid skin tag approx 2 x 2 cm. Extremities:  Good strength and ROM  in upper and lower extremities. Neurologic:  Grossly intact to motor and sensory function.     (<--- towards head)           Left anterior anal skin tag  DATA REVIEWED: Epic notes  Ovidio Kinavid Darby Fleeman, MD,  [facility], [place] [address]  [address]   [place], [place]    [id number] Phone:  [phone] FAX:  [phone]

## 2013-10-28 ENCOUNTER — Other Ambulatory Visit: Payer: Self-pay | Admitting: Emergency Medicine

## 2013-11-05 ENCOUNTER — Other Ambulatory Visit (INDEPENDENT_AMBULATORY_CARE_PROVIDER_SITE_OTHER): Payer: Self-pay

## 2013-11-05 ENCOUNTER — Other Ambulatory Visit (INDEPENDENT_AMBULATORY_CARE_PROVIDER_SITE_OTHER): Payer: Self-pay | Admitting: Surgery

## 2013-11-05 DIAGNOSIS — K644 Residual hemorrhoidal skin tags: Secondary | ICD-10-CM

## 2013-11-07 ENCOUNTER — Telehealth (INDEPENDENT_AMBULATORY_CARE_PROVIDER_SITE_OTHER): Payer: Self-pay

## 2013-11-07 NOTE — Telephone Encounter (Signed)
Pt calling to see if she can start using her nitroglycerin ointment on her anal fissure again. The pt just had sx on 11/05/13 to have rectal skin tag removed. Please advise.

## 2013-11-08 ENCOUNTER — Telehealth (INDEPENDENT_AMBULATORY_CARE_PROVIDER_SITE_OTHER): Payer: Self-pay

## 2013-11-08 NOTE — Telephone Encounter (Signed)
V/M It is fine for her to use the nitroglycerin ointment for her anal fissure per Dr. Ezzard StandingNewman

## 2013-11-19 ENCOUNTER — Encounter: Payer: Self-pay | Admitting: Emergency Medicine

## 2013-11-19 ENCOUNTER — Ambulatory Visit (INDEPENDENT_AMBULATORY_CARE_PROVIDER_SITE_OTHER): Payer: BC Managed Care – PPO | Admitting: Emergency Medicine

## 2013-11-19 VITALS — BP 108/64 | HR 92 | Temp 98.2°F | Resp 18 | Ht 63.5 in | Wt 151.0 lb

## 2013-11-19 DIAGNOSIS — R6889 Other general symptoms and signs: Secondary | ICD-10-CM

## 2013-11-19 DIAGNOSIS — E782 Mixed hyperlipidemia: Secondary | ICD-10-CM

## 2013-11-19 DIAGNOSIS — E559 Vitamin D deficiency, unspecified: Secondary | ICD-10-CM

## 2013-11-19 LAB — CBC WITH DIFFERENTIAL/PLATELET
BASOS ABS: 0 10*3/uL (ref 0.0–0.1)
BASOS PCT: 0 % (ref 0–1)
Eosinophils Absolute: 0.1 10*3/uL (ref 0.0–0.7)
Eosinophils Relative: 1 % (ref 0–5)
HEMATOCRIT: 38.1 % (ref 36.0–46.0)
HEMOGLOBIN: 13.2 g/dL (ref 12.0–15.0)
LYMPHS PCT: 34 % (ref 12–46)
Lymphs Abs: 2.3 10*3/uL (ref 0.7–4.0)
MCH: 31.1 pg (ref 26.0–34.0)
MCHC: 34.6 g/dL (ref 30.0–36.0)
MCV: 89.9 fL (ref 78.0–100.0)
MONOS PCT: 8 % (ref 3–12)
Monocytes Absolute: 0.5 10*3/uL (ref 0.1–1.0)
NEUTROS ABS: 3.8 10*3/uL (ref 1.7–7.7)
Neutrophils Relative %: 57 % (ref 43–77)
Platelets: 285 10*3/uL (ref 150–400)
RBC: 4.24 MIL/uL (ref 3.87–5.11)
RDW: 13.6 % (ref 11.5–15.5)
WBC: 6.7 10*3/uL (ref 4.0–10.5)

## 2013-11-19 NOTE — Patient Instructions (Signed)
Vitamin B12 Deficiency Not having enough vitamin B12 is called a deficiency. Your body needs this vitamin for important body functions. HOME CARE  Take all vitamins, herbs, or nutrition drinks (supplements) as told by your doctor.  Get shots (injections) as told. Do not miss your doctor visit.  Eat foods than contain vitamin B12. This includes:  Meat.  Chicken, turkey, or other birds (poultry).  Fish.  Eggs.  Cereals or milk with added vitamin B12. Check the label.  Do not drink too much (abuse) alcohol.  Keep all doctor visits as told. GET HELP IF:  You have questions.  Your problems come back. MAKE SURE YOU:  Understand these instructions.  Will watch your condition.  Will get help right away if you are not doing well or get worse. Document Released: 06/02/2011 Document Revised: 09/05/2011 Document Reviewed: 06/02/2011 Exit[employer] Patient Information 2014 [employer].  

## 2013-11-19 NOTE — Progress Notes (Signed)
Subjective:    Patient ID: Maria Arias, female    DOB: 05/27/1971, 43 y.o.   MRN: [id number]  HPI Comments: 43 yo WF for cholesterol and vitamin f/u. She did not start vitamins AD for B12/ Vit D/ magnesium. She is exercising more and eating healthier. She has decreased portions sizes. She is trying to maintain weight loss and stay off cholesterol RX. She wants to also have CRP lab drawn with Fhx of cardiac disease. WBC             6.0   08/08/2013 HGB            13.4   08/08/2013 HCT            38.7   08/08/2013 PLT             270   08/08/2013 GLUCOSE          92   07/11/2013 CHOL            187   07/11/2013 TRIG            125   07/11/2013 HDL              59   07/11/2013 LDLCALC         103   07/11/2013 ALT              14   07/11/2013 AST              15   07/11/2013 NA              138   07/11/2013 K               3.5   07/11/2013 CL              108   07/11/2013 CREATININE     0.87   07/11/2013 BUN              11   07/11/2013 CO2              21   07/11/2013 TSH           1.585   07/11/2013 HGBA1C          5.1   08/08/2013 MICROALBUR     0.60   08/08/2013     Medication List       This list is accurate as of: 11/19/13  2:07 PM.  Always use your most recent med list.               AMBULATORY NON FORMULARY MEDICATION  Medication Name: Nitroglycerin ointment 0.125% use as needed rectally four times a day     levonorgestrel-ethinyl estradiol 0.15-0.03 MG tablet  Commonly known as:  SEASONALE,INTROVALE,JOLESSA  Take 1 tablet by mouth daily.     rifampin 300 MG capsule  Commonly known as:  RIFADIN  Take 1 capsule (300 mg total) by mouth daily.     sertraline 100 MG tablet  Commonly known as:  ZOLOFT  Take 150 mg by mouth daily.     topiramate 100 MG tablet  Commonly known as:  TOPAMAX  Take 100 mg by mouth 2 (two) times daily.     traZODone 100 MG tablet  Commonly known as:  DESYREL  TAKE 1 & 1/2 TABLETS AT BEDTIME       Allergies  Allergen Reactions  . Ppd  [Tuberculin Purified Protein Derivative]     +ppd with treatment 08/08/12  Past Medical History  Diagnosis Date  . GERD (gastroesophageal reflux disease)   . Hyperlipidemia   . Anxiety   . Depression   . Migraines   . 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 Anal fissure   . Irritable bowel disease   . Arthritis       Review of Systems  All other systems reviewed and are negative.  BP 108/64  Pulse 92  Temp(Src) 98.2 F (36.8 C) (Temporal)  Resp 18  Ht 5' 3.5" (1.613 m)  Wt 151 lb (68.493 kg)  BMI 26.33 kg/m2  LMP 11/08/2013     Objective:   Physical Exam  Nursing note and vitals reviewed. Constitutional: She is oriented to person, place, and time. She appears well-developed and well-nourished. No distress.  HENT:  Head: Normocephalic and atraumatic.  Right Ear: External ear normal.  Left Ear: External ear normal.  Nose: Nose normal.  Mouth/Throat: Oropharynx is clear and moist.  Eyes: Conjunctivae and EOM are normal.  Neck: Normal range of motion. Neck supple. No JVD present. No thyromegaly present.  Cardiovascular: Normal rate, regular rhythm, normal heart sounds and intact distal pulses.   Pulmonary/Chest: Effort normal and breath sounds normal.  Abdominal: Soft. Bowel sounds are normal. She exhibits no distension. There is no tenderness.  Musculoskeletal: Normal range of motion. She exhibits no edema and no tenderness.  Lymphadenopathy:    She has no cervical adenopathy.  Neurological: She is alert and oriented to person, place, and time. No cranial nerve deficit.  Skin: Skin is warm and dry. No rash noted. No erythema. No pallor.  Psychiatric: She has a normal mood and affect. Her behavior is normal. Judgment and thought content normal.          Assessment & Plan:  1. Cholesterol- recheck labs, Need to eat healthier and exercise AD.  2. Low Vitamins- REcheck labs

## 2013-11-20 LAB — LIPID PANEL
CHOL/HDL RATIO: 2.9 ratio
CHOLESTEROL: 187 mg/dL (ref 0–200)
HDL: 64 mg/dL (ref >39)
LDL CALC: 104 mg/dL — AB (ref 0–99)
Triglycerides: 96 mg/dL (ref <150)
VLDL: 19 mg/dL (ref 0–40)

## 2013-11-20 LAB — HEPATIC FUNCTION PANEL
ALK PHOS: 62 U/L (ref 39–117)
ALT: 21 U/L (ref 0–35)
AST: 18 U/L (ref 0–37)
Albumin: 3.8 g/dL (ref 3.5–5.2)
BILIRUBIN INDIRECT: 0.4 mg/dL (ref 0.2–1.2)
BILIRUBIN TOTAL: 0.5 mg/dL (ref 0.2–1.2)
Bilirubin, Direct: 0.1 mg/dL (ref 0.0–0.3)
Total Protein: 6.1 g/dL (ref 6.0–8.3)

## 2013-11-20 LAB — BASIC METABOLIC PANEL WITH GFR
BUN: 12 mg/dL (ref 6–23)
CHLORIDE: 107 meq/L (ref 96–112)
CO2: 24 mEq/L (ref 19–32)
Calcium: 8.6 mg/dL (ref 8.4–10.5)
Creat: 0.74 mg/dL (ref 0.50–1.10)
Glucose, Bld: 108 mg/dL — ABNORMAL HIGH (ref 70–99)
POTASSIUM: 3.6 meq/L (ref 3.5–5.3)
Sodium: 138 mEq/L (ref 135–145)

## 2013-11-20 LAB — MAGNESIUM: MAGNESIUM: 2 mg/dL (ref 1.5–2.5)

## 2013-11-20 LAB — VITAMIN D 25 HYDROXY (VIT D DEFICIENCY, FRACTURES): Vit D, 25-Hydroxy: 38 ng/mL (ref 30–89)

## 2013-11-20 LAB — C-REACTIVE PROTEIN: CRP: <0.5 mg/dL (ref <0.60)

## 2013-11-20 LAB — VITAMIN B12: Vitamin B-12: 282 pg/mL (ref 211–911)

## 2013-11-24 ENCOUNTER — Other Ambulatory Visit: Payer: Self-pay | Admitting: Emergency Medicine

## 2013-12-05 ENCOUNTER — Ambulatory Visit (INDEPENDENT_AMBULATORY_CARE_PROVIDER_SITE_OTHER): Payer: BC Managed Care – PPO | Admitting: Surgery

## 2013-12-05 ENCOUNTER — Encounter (INDEPENDENT_AMBULATORY_CARE_PROVIDER_SITE_OTHER): Payer: Self-pay | Admitting: Surgery

## 2013-12-05 VITALS — BP 126/78 | HR 66 | Temp 97.5°F | Ht 63.0 in | Wt 151.0 lb

## 2013-12-05 DIAGNOSIS — K644 Residual hemorrhoidal skin tags: Secondary | ICD-10-CM

## 2013-12-05 DIAGNOSIS — K602 Anal fissure, unspecified: Secondary | ICD-10-CM

## 2013-12-05 NOTE — Progress Notes (Signed)
Re:   Maria Arias DOB:   1971/02/26 MRN:   [id number]  ASSESSMENT AND PLAN: 1.  Excise right anterior anal tag - SCG - 11/05/2013  This has done well.   [Note:  I took photos at her first visit]  I gave her the path report.  Return appt is PRN.   2.  Posterior anal fissure with sentinel tag  This seems to be healing. 3.  Loose stools/IBS  Controlling this will help with the fissure. 4.  History of anxiety/depression 5.  GERD 6.  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 7.  Migraine headaches  Better with divorce and on Topamax  Chief Complaint  Patient presents with  . anal skin tag   REFERRING PHYSICIAN: Heide Scales, FNP  HISTORY OF PRESENT ILLNESS: Maria Arias is a 43 y.o. (DOB: [birth year]) white  female whose primary care physician is Heide Scales, FNP and comes to me today for anal skin tags. She comes by herself. She has done well.  She said the first two weeks were a little rough, but then the wound started itching and she has done well since then.  She is using the nitroglycerin.  History of rectal problems: The patient has had a prior anal fissure that is healed. Then about a month ago, she developed another anal fissure. She has loose stools daily, about once or twice a day. A lot of this is been attributed to her divorce and problems with her former husband.  She is seeing Dr. Arlyce Dice.  She is having pain with bowel movements, loose stools 1 or 2 times per day, and has an external anal  skin tag that bothers her.  She is on anal nitroglycerin for her fissure and this seems to be helping.  The bleeding is also getting better.  Past Medical History  Diagnosis Date  . GERD (gastroesophageal reflux disease)   . Hyperlipidemia   . Anxiety   . Depression   . Migraines   . 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 Anal fissure   . Irritable bowel disease   . Arthritis     Past Surgical History   Procedure Laterality Date  . Appendectomy    . Knee arthroscopy Right   . Ankle surgery Left     Current Outpatient Prescriptions  Medication Sig Dispense Refill  . AMBULATORY NON FORMULARY MEDICATION Medication Name: Nitroglycerin ointment 0.125% use as needed rectally four times a day  30 g  1  . levonorgestrel-ethinyl estradiol (SEASONALE,INTROVALE,JOLESSA) 0.15-0.03 MG tablet Take 1 tablet by mouth daily.      . rifampin (RIFADIN) 300 MG capsule TAKE 1 CAPSULE (300 MG TOTAL) BY MOUTH DAILY.  30 capsule  3  . sertraline (ZOLOFT) 100 MG tablet Take 150 mg by mouth daily.      Marland Kitchen topiramate (TOPAMAX) 100 MG tablet Take 100 mg by mouth 2 (two) times daily.      . traZODone (DESYREL) 100 MG tablet TAKE 1 & 1/2 TABLETS AT BEDTIME  45 tablet  1   No current facility-administered medications for this visit.      Allergies  Allergen Reactions  . Ppd [Tuberculin Purified Protein Derivative]     +ppd with treatment 08/08/12    REVIEW OF SYSTEMS: Skin:  No history of rash.  No history of abnormal moles. Infection:  Being treated for TB.  Source unclear.  Tried Isoniazid, but could not tolerate it.  Now starting Rifampin. Neurologic:  Migraine headaches.  Got a bunch of injections to scalp.  Now on Topamax.  Sees [facility] at [facility]. Gastrointestinal:  No history of stomach disease.  No history of liver disease.  No history of gall bladder disease.  No history of pancreas disease.  Had appendectomy, open, 1985.  SOCIAL and FAMILY HISTORY: Recently divorced.  Sounds like she has had a lot of issues related to the anxiety around her divorce. Works as Landscape architectforensic accountant Has one daughter, age 607  PHYSICAL EXAM: BP 126/78  Pulse 66  Temp(Src) 97.5 F (36.4 C)  Ht 5\' 3"  (1.6 m)  Wt 151 lb (68.493 kg)  BMI 26.76 kg/m2  LMP 11/08/2013  General: WN WF who is alert and generally healthy appearing.  HEENT: Normal. Pupils equal. Rectal: Posterior anal fissure with  sentinel skin tags.  This looks like it is trying to heal.  The skin tag incision looks good.  There is just a little scar there.  DATA REVIEWED: Epic notes  Ovidio Kinavid Keanthony Poole, MD,  [facility], [place] [address]  [address]   [place], [place]    [id number] Phone:  [phone] FAX:  [phone]

## 2013-12-10 ENCOUNTER — Encounter (INDEPENDENT_AMBULATORY_CARE_PROVIDER_SITE_OTHER): Payer: BC Managed Care – PPO | Admitting: Surgery

## 2014-03-17 ENCOUNTER — Ambulatory Visit (INDEPENDENT_AMBULATORY_CARE_PROVIDER_SITE_OTHER): Payer: BC Managed Care – PPO | Admitting: Physician Assistant

## 2014-03-17 VITALS — BP 120/70 | HR 64 | Temp 97.9°F | Resp 16 | Ht 63.5 in | Wt 146.0 lb

## 2014-03-17 DIAGNOSIS — B850 Pediculosis due to Pediculus humanus capitis: Secondary | ICD-10-CM

## 2014-03-17 MED ORDER — BENZYL ALCOHOL 5 % EX LOTN: TOPICAL_LOTION | CUTANEOUS | Status: DC

## 2014-03-17 NOTE — Patient Instructions (Signed)
Head and Pubic Lice °Lice are tiny, light brown insects with claws on the ends of their legs. They are small parasites that live on the human body. Lice often make their home in your hair. They hatch from little round eggs (nits), which are attached to the base of hairs. They spread by: °· Direct contact with an infested person. °· Infested personal items such as combs, brushes, towels, clothing, pillow cases and sheets. °The parasite that causes your condition may also live in clothes which have been worn within the week before treatment. Therefore, it is necessary to wash your clothes, bed linens, towels, combs and brushes. Any woolens can be put in an air-tight plastic bag for one week. You need to use fresh clothes, towels and sheets after your treatment is completed. Re-treatment is usually not necessary if instructions are followed. If necessary, treatment may be repeated in 7 days. The entire family may require treatment. Sexual partners should be treated if the nits are present in the pubic area. °TREATMENT °· Apply enough medicated shampoo or cream to wet hair and skin in and around the infected areas. °· Work thoroughly into hair and leave in according to instructions. °· Add a small amount of water until a good lather forms. °· Rinse thoroughly. °· Towel briskly. °· When hair is dry, any remaining nits, cream or shampoo may be removed with a fine-tooth comb or tweezers. The nits resemble dandruff; however they are glued to the hair follicle and are difficult to brush out. Frequent fine combing and shampoos are necessary. A towel soaked in white vinegar and left on the hair for 2 hours will also help soften the glue which holds the nits on the hair. °Medicated shampoo or cream should not be used on children or pregnant women without a caregiver's prescription or instructions. °SEEK MEDICAL CARE IF:  °· You or your child develops sores that look infected. °· The rash does not go away in one week. °· The  lice or nits return or persist in spite of treatment. °Document Released: 06/13/2005 Document Revised: 09/05/2011 Document Reviewed: 01/10/2007 °ExitCare® Patient Information ©2015 [employer]. This information is not intended to replace advice given to you by your health care provider. Make sure you discuss any questions you have with your health care provider. ° °

## 2014-03-17 NOTE — Progress Notes (Signed)
   Subjective:    Patient ID: Maria Arias, female    DOB: 1971-04-01, 43 y.o.   MRN: [id number]  HPI 43 y.o. female presents with rash. She states that last week she was on a business trip but her daughter had lice at work. She was given lice treatment and that has improved but she has also had itching/a rash on her neck/lower back. She was given permethrin cream 5% which has helped but she is still itching.    Review of Systems  Skin: Positive for rash.  All other systems reviewed and are negative.      Objective:   Physical Exam  Constitutional: She is oriented to person, place, and time. She appears well-developed and well-nourished.  HENT:  Head: Normocephalic and atraumatic.  Neck: Normal range of motion. Neck supple.  Cardiovascular: Normal rate and regular rhythm.   Pulmonary/Chest: Effort normal and breath sounds normal.  Abdominal: Soft. Bowel sounds are normal.  Musculoskeletal: Normal range of motion.  Neurological: She is alert and oriented to person, place, and time.  Skin: Skin is warm and dry.  + lice on hair follicles and small erythematous bumps on along neck line      Assessment & Plan:  Lice- continue medications and can do benozyl alcohol

## 2014-05-26 ENCOUNTER — Ambulatory Visit: Payer: Self-pay | Admitting: Emergency Medicine

## 2014-05-29 ENCOUNTER — Encounter: Payer: Self-pay | Admitting: Emergency Medicine

## 2014-05-29 ENCOUNTER — Ambulatory Visit (INDEPENDENT_AMBULATORY_CARE_PROVIDER_SITE_OTHER): Payer: BC Managed Care – PPO | Admitting: Emergency Medicine

## 2014-05-29 VITALS — BP 100/64 | HR 70 | Temp 98.2°F | Resp 16 | Ht 63.5 in | Wt 146.0 lb

## 2014-05-29 DIAGNOSIS — R739 Hyperglycemia, unspecified: Secondary | ICD-10-CM

## 2014-05-29 DIAGNOSIS — R5383 Other fatigue: Secondary | ICD-10-CM

## 2014-05-29 DIAGNOSIS — E782 Mixed hyperlipidemia: Secondary | ICD-10-CM

## 2014-05-29 DIAGNOSIS — Z23 Encounter for immunization: Secondary | ICD-10-CM

## 2014-05-29 DIAGNOSIS — R5381 Other malaise: Secondary | ICD-10-CM

## 2014-05-29 LAB — CBC WITH DIFFERENTIAL/PLATELET
BASOS ABS: 0 10*3/uL (ref 0.0–0.1)
BASOS PCT: 0 % (ref 0–1)
EOS ABS: 0.1 10*3/uL (ref 0.0–0.7)
EOS PCT: 1 % (ref 0–5)
HCT: 39.8 % (ref 36.0–46.0)
Hemoglobin: 13.2 g/dL (ref 12.0–15.0)
Lymphocytes Relative: 40 % (ref 12–46)
Lymphs Abs: 2.1 10*3/uL (ref 0.7–4.0)
MCH: 30.6 pg (ref 26.0–34.0)
MCHC: 33.2 g/dL (ref 30.0–36.0)
MCV: 92.1 fL (ref 78.0–100.0)
MONO ABS: 0.5 10*3/uL (ref 0.1–1.0)
MPV: 9.8 fL (ref 9.4–12.4)
Monocytes Relative: 9 % (ref 3–12)
Neutro Abs: 2.7 10*3/uL (ref 1.7–7.7)
Neutrophils Relative %: 50 % (ref 43–77)
Platelets: 241 10*3/uL (ref 150–400)
RBC: 4.32 MIL/uL (ref 3.87–5.11)
RDW: 13.5 % (ref 11.5–15.5)
WBC: 5.3 10*3/uL (ref 4.0–10.5)

## 2014-05-29 LAB — LIPID PANEL
CHOL/HDL RATIO: 2.4 ratio
CHOLESTEROL: 149 mg/dL (ref 0–200)
HDL: 62 mg/dL (ref >39)
LDL Cholesterol: 72 mg/dL (ref 0–99)
TRIGLYCERIDES: 74 mg/dL (ref <150)
VLDL: 15 mg/dL (ref 0–40)

## 2014-05-29 LAB — BASIC METABOLIC PANEL WITH GFR
BUN: 9 mg/dL (ref 6–23)
CALCIUM: 9.2 mg/dL (ref 8.4–10.5)
CO2: 22 mEq/L (ref 19–32)
CREATININE: 0.8 mg/dL (ref 0.50–1.10)
Chloride: 110 mEq/L (ref 96–112)
GFR, Est Non African American: >89 mL/min
GLUCOSE: 89 mg/dL (ref 70–99)
Potassium: 4 mEq/L (ref 3.5–5.3)
SODIUM: 139 meq/L (ref 135–145)

## 2014-05-29 LAB — IRON AND TIBC
%SAT: 28 % (ref 20–55)
Iron: 84 ug/dL (ref 42–145)
TIBC: 303 ug/dL (ref 250–470)
UIBC: 219 ug/dL (ref 125–400)

## 2014-05-29 LAB — TSH: TSH: 2.853 u[IU]/mL (ref 0.350–4.500)

## 2014-05-29 LAB — HEPATIC FUNCTION PANEL
ALBUMIN: 3.8 g/dL (ref 3.5–5.2)
ALT: 20 U/L (ref 0–35)
AST: 20 U/L (ref 0–37)
Alkaline Phosphatase: 52 U/L (ref 39–117)
BILIRUBIN DIRECT: 0.1 mg/dL (ref 0.0–0.3)
Indirect Bilirubin: 0.3 mg/dL (ref 0.2–1.2)
TOTAL PROTEIN: 6.5 g/dL (ref 6.0–8.3)
Total Bilirubin: 0.4 mg/dL (ref 0.2–1.2)

## 2014-05-29 LAB — VITAMIN B12: VITAMIN B 12: 289 pg/mL (ref 211–911)

## 2014-05-29 NOTE — Progress Notes (Signed)
Subjective:    Patient ID: Maria Arias, female    DOB: 11-21-1970, 43 y.o.   MRN: [id number]  HPI Comments: 43 yo WF presents for 3 month F/U for Cholesterol, Pre-Dm, D. Deficient. She is eating a little better with decreased portions. She is exercising more. She notes stress has improved. She is till a little more fatigued than she would like.  She recently bought new home and has notes a couple spots/ bites on her abdomen and legs. She notes itchy. She has tried OTC topicals without complete relief.    Lab Results      Component                Value               Date                      WBC                      6.7                 11/19/2013                HGB                      13.2                11/19/2013                HCT                      38.1                11/19/2013                PLT                      285                 11/19/2013                GLUCOSE                  108*                11/19/2013                CHOL                     187                 11/19/2013                TRIG                     96                  11/19/2013                HDL                      64                  11/19/2013                LDLCALC  104*                11/19/2013                ALT                      21                  11/19/2013                AST                      18                  11/19/2013                NA                       138                 11/19/2013                K                        3.6                 11/19/2013                CL                       107                 11/19/2013                CREATININE               0.74                11/19/2013                BUN                      12                  11/19/2013                CO2                      24                  11/19/2013                TSH                      1.585               07/11/2013                HGBA1C                   5.1                  08/08/2013                MICROALBUR               0.60  08/08/2013               Medication List       This list is accurate as of: 05/29/14 10:40 AM.  Always use your most recent med list.               LOMEDIA 24 FE 1-20 MG-MCG(24) tablet  Generic drug:  Norethindrone Acetate-Ethinyl Estrad-FE  Take 1 tablet by mouth daily.     sertraline 100 MG tablet  Commonly known as:  ZOLOFT  Take 150 mg by mouth daily.     topiramate 100 MG tablet  Commonly known as:  TOPAMAX  Take 100 mg by mouth 2 (two) times daily.     traZODone 100 MG tablet  Commonly known as:  DESYREL  TAKE 1 & 1/2 TABLETS AT BEDTIME       Allergies  Allergen Reactions  . Ppd [Tuberculin Purified Protein Derivative]     +ppd with treatment 08/08/12   Past Medical History  Diagnosis Date  . GERD (gastroesophageal reflux disease)   . Hyperlipidemia   . Anxiety   . Depression   . Migraines   . 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 Anal fissure   . Irritable bowel disease   . Arthritis       Review of Systems  Constitutional: Positive for fatigue.  Skin: Positive for rash.  All other systems reviewed and are negative.  BP 100/64 mmHg  Pulse 70  Temp(Src) 98.2 F (36.8 C) (Temporal)  Resp 16  Ht 5' 3.5" (1.613 m)  Wt 146 lb (66.225 kg)  BMI 25.45 kg/m2  LMP 05/19/2014      Objective:   Physical Exam  Constitutional: She is oriented to person, place, and time. She appears well-developed and well-nourished. No distress.  HENT:  Head: Normocephalic and atraumatic.  Right Ear: External ear normal.  Left Ear: External ear normal.  Eyes: Conjunctivae and EOM are normal.  Neck: Normal range of motion. Neck supple. No JVD present. No thyromegaly present.  Cardiovascular: Normal rate, regular rhythm, normal heart sounds and intact distal pulses.   Pulmonary/Chest: Effort normal and breath sounds normal.  Abdominal: Soft. Bowel sounds are normal.   Musculoskeletal: Normal range of motion. She exhibits no edema or tenderness.  Lymphadenopathy:    She has no cervical adenopathy.  Neurological: She is alert and oriented to person, place, and time. No cranial nerve deficit.  Skin: Skin is warm and dry. Rash noted. No erythema. No pallor.  Several scattered 3-4 mm bite appearing areas of erythema with minimal scaling/ elevation  Psychiatric: She has a normal mood and affect. Her behavior is normal. Judgment and thought content normal.  Nursing note and vitals reviewed.       Assessment & Plan:  1. Elevated Cholesterol/ Elevated BS- recheck labs, Need to eat healthier and exercise AD.  2. Fatigue- check labs, increase activity and H2O   3. ? Bug bites- advised topical steroids, Zyrtec AD, fumigate new home. Call with concerns.

## 2014-05-29 NOTE — Patient Instructions (Signed)

## 2014-05-30 LAB — HEMOGLOBIN A1C
HEMOGLOBIN A1C: 4.6 % (ref <5.7)
Mean Plasma Glucose: 85 mg/dL (ref <117)

## 2014-05-30 LAB — INSULIN, FASTING: Insulin fasting, serum: 4.6 u[IU]/mL (ref 2.0–19.6)

## 2014-08-09 ENCOUNTER — Other Ambulatory Visit: Payer: Self-pay | Admitting: Emergency Medicine

## 2014-08-11 ENCOUNTER — Encounter: Payer: Self-pay | Admitting: Emergency Medicine

## 2014-08-16 ENCOUNTER — Encounter: Payer: Self-pay | Admitting: *Deleted

## 2014-09-17 ENCOUNTER — Other Ambulatory Visit: Payer: Self-pay | Admitting: Internal Medicine

## 2015-05-15 ENCOUNTER — Other Ambulatory Visit: Payer: Self-pay | Admitting: Physician Assistant

## 2015-05-16 ENCOUNTER — Other Ambulatory Visit: Payer: Self-pay | Admitting: Physician Assistant

## 2015-05-22 ENCOUNTER — Other Ambulatory Visit: Payer: Self-pay | Admitting: Physician Assistant

## 2015-12-02 ENCOUNTER — Ambulatory Visit: Payer: Self-pay | Admitting: Podiatry

## 2016-03-07 MED FILL — HYDROCODON-APAP 5-325: 5-325 | 4 days supply | Qty: 25 | Fill #0

## 2021-04-13 DIAGNOSIS — E785 Hyperlipidemia, unspecified: Secondary | ICD-10-CM | POA: Diagnosis not present

## 2021-04-20 DIAGNOSIS — Z1331 Encounter for screening for depression: Secondary | ICD-10-CM | POA: Diagnosis not present

## 2021-04-20 DIAGNOSIS — F419 Anxiety disorder, unspecified: Secondary | ICD-10-CM | POA: Diagnosis not present

## 2021-04-20 DIAGNOSIS — Z0000 Encounter for general adult medical examination without abnormal findings: Secondary | ICD-10-CM | POA: Diagnosis not present

## 2021-04-20 DIAGNOSIS — Z20828 Contact with and (suspected) exposure to other viral communicable diseases: Secondary | ICD-10-CM | POA: Diagnosis not present

## 2021-04-20 DIAGNOSIS — Z1339 Encounter for screening examination for other mental health and behavioral disorders: Secondary | ICD-10-CM | POA: Diagnosis not present

## 2021-05-05 DIAGNOSIS — M545 Low back pain, unspecified: Secondary | ICD-10-CM | POA: Diagnosis not present

## 2021-05-05 DIAGNOSIS — M25511 Pain in right shoulder: Secondary | ICD-10-CM | POA: Diagnosis not present

## 2021-05-18 DIAGNOSIS — M5412 Radiculopathy, cervical region: Secondary | ICD-10-CM | POA: Diagnosis not present

## 2021-05-18 DIAGNOSIS — S46911S Strain of unspecified muscle, fascia and tendon at shoulder and upper arm level, right arm, sequela: Secondary | ICD-10-CM | POA: Diagnosis not present

## 2021-05-18 DIAGNOSIS — M5136 Other intervertebral disc degeneration, lumbar region: Secondary | ICD-10-CM | POA: Diagnosis not present

## 2021-05-24 DIAGNOSIS — S46911S Strain of unspecified muscle, fascia and tendon at shoulder and upper arm level, right arm, sequela: Secondary | ICD-10-CM | POA: Diagnosis not present

## 2021-05-24 DIAGNOSIS — M5412 Radiculopathy, cervical region: Secondary | ICD-10-CM | POA: Diagnosis not present

## 2021-05-26 DIAGNOSIS — M5136 Other intervertebral disc degeneration, lumbar region: Secondary | ICD-10-CM | POA: Diagnosis not present

## 2021-05-28 DIAGNOSIS — S46911S Strain of unspecified muscle, fascia and tendon at shoulder and upper arm level, right arm, sequela: Secondary | ICD-10-CM | POA: Diagnosis not present

## 2021-05-28 DIAGNOSIS — M5412 Radiculopathy, cervical region: Secondary | ICD-10-CM | POA: Diagnosis not present

## 2021-05-31 DIAGNOSIS — M5136 Other intervertebral disc degeneration, lumbar region: Secondary | ICD-10-CM | POA: Diagnosis not present

## 2021-06-02 DIAGNOSIS — S46911S Strain of unspecified muscle, fascia and tendon at shoulder and upper arm level, right arm, sequela: Secondary | ICD-10-CM | POA: Diagnosis not present

## 2021-06-02 DIAGNOSIS — M5412 Radiculopathy, cervical region: Secondary | ICD-10-CM | POA: Diagnosis not present

## 2021-06-04 DIAGNOSIS — M5412 Radiculopathy, cervical region: Secondary | ICD-10-CM | POA: Diagnosis not present

## 2021-06-04 DIAGNOSIS — S46911S Strain of unspecified muscle, fascia and tendon at shoulder and upper arm level, right arm, sequela: Secondary | ICD-10-CM | POA: Diagnosis not present

## 2021-06-09 DIAGNOSIS — M5136 Other intervertebral disc degeneration, lumbar region: Secondary | ICD-10-CM | POA: Diagnosis not present

## 2021-06-11 DIAGNOSIS — M5412 Radiculopathy, cervical region: Secondary | ICD-10-CM | POA: Diagnosis not present

## 2021-06-11 DIAGNOSIS — M5136 Other intervertebral disc degeneration, lumbar region: Secondary | ICD-10-CM | POA: Diagnosis not present

## 2021-06-11 DIAGNOSIS — S46911S Strain of unspecified muscle, fascia and tendon at shoulder and upper arm level, right arm, sequela: Secondary | ICD-10-CM | POA: Diagnosis not present

## 2021-06-14 DIAGNOSIS — M5412 Radiculopathy, cervical region: Secondary | ICD-10-CM | POA: Diagnosis not present

## 2021-06-14 DIAGNOSIS — M4722 Other spondylosis with radiculopathy, cervical region: Secondary | ICD-10-CM | POA: Diagnosis not present

## 2021-06-14 DIAGNOSIS — M5136 Other intervertebral disc degeneration, lumbar region: Secondary | ICD-10-CM | POA: Diagnosis not present

## 2021-06-16 DIAGNOSIS — M5412 Radiculopathy, cervical region: Secondary | ICD-10-CM | POA: Diagnosis not present

## 2021-06-16 DIAGNOSIS — S46911S Strain of unspecified muscle, fascia and tendon at shoulder and upper arm level, right arm, sequela: Secondary | ICD-10-CM | POA: Diagnosis not present

## 2021-07-01 DIAGNOSIS — S46911S Strain of unspecified muscle, fascia and tendon at shoulder and upper arm level, right arm, sequela: Secondary | ICD-10-CM | POA: Diagnosis not present

## 2021-07-01 DIAGNOSIS — M5412 Radiculopathy, cervical region: Secondary | ICD-10-CM | POA: Diagnosis not present

## 2021-07-03 ENCOUNTER — Other Ambulatory Visit: Payer: Self-pay | Admitting: Orthopedic Surgery

## 2021-07-03 DIAGNOSIS — M25511 Pain in right shoulder: Secondary | ICD-10-CM

## 2021-07-03 DIAGNOSIS — M542 Cervicalgia: Secondary | ICD-10-CM

## 2021-07-13 DIAGNOSIS — S46911S Strain of unspecified muscle, fascia and tendon at shoulder and upper arm level, right arm, sequela: Secondary | ICD-10-CM | POA: Diagnosis not present

## 2021-07-13 DIAGNOSIS — M5412 Radiculopathy, cervical region: Secondary | ICD-10-CM | POA: Diagnosis not present

## 2021-07-15 DIAGNOSIS — M5136 Other intervertebral disc degeneration, lumbar region: Secondary | ICD-10-CM | POA: Diagnosis not present

## 2021-07-18 ENCOUNTER — Ambulatory Visit
Admission: RE | Admit: 2021-07-18 | Discharge: 2021-07-18 | Disposition: A | Discharge status: Home / Self Care | Payer: BC Managed Care – PPO | Source: Ambulatory Visit | Attending: Orthopedic Surgery | Admitting: Orthopedic Surgery

## 2021-07-18 ENCOUNTER — Other Ambulatory Visit: Payer: Self-pay

## 2021-07-18 DIAGNOSIS — M5021 Other cervical disc displacement,  high cervical region: Secondary | ICD-10-CM | POA: Diagnosis not present

## 2021-07-18 DIAGNOSIS — M25511 Pain in right shoulder: Secondary | ICD-10-CM

## 2021-07-18 DIAGNOSIS — G43909 Migraine, unspecified, not intractable, without status migrainosus: Secondary | ICD-10-CM | POA: Diagnosis not present

## 2021-07-18 DIAGNOSIS — M50223 Other cervical disc displacement at C6-C7 level: Secondary | ICD-10-CM | POA: Diagnosis not present

## 2021-07-18 DIAGNOSIS — M542 Cervicalgia: Secondary | ICD-10-CM

## 2021-07-18 DIAGNOSIS — S22009A Unspecified fracture of unspecified thoracic vertebra, initial encounter for closed fracture: Secondary | ICD-10-CM | POA: Diagnosis not present

## 2021-07-18 DIAGNOSIS — M7551 Bursitis of right shoulder: Secondary | ICD-10-CM | POA: Diagnosis not present

## 2021-07-18 DIAGNOSIS — R531 Weakness: Secondary | ICD-10-CM | POA: Diagnosis not present

## 2021-07-18 DIAGNOSIS — M50222 Other cervical disc displacement at C5-C6 level: Secondary | ICD-10-CM | POA: Diagnosis not present

## 2021-07-18 IMAGING — MR MR CERVICAL SPINE W/O CM
4 of 5 series · 29 of 48 positions shown · non-contrast
Comparison: None available.

CLINICAL DATA: Initial evaluation for chronic right shoulder and
neck pain with migraine headaches.

EXAM:
MRI CERVICAL SPINE WITHOUT CONTRAST
TECHNIQUE: Multiplanar, multisequence MR imaging of the cervical spine was
performed. No intravenous contrast was administered.

[Series 2: T2 · sagittal · 3.0mm · 0.66mm/px · 7 of 15 slices shown (1 of 2)]
[im 1/15]
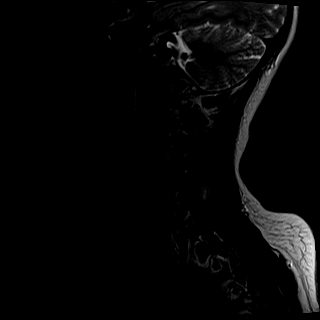
[im 3/15]
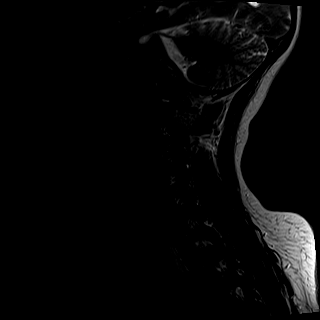
[im 5/15]
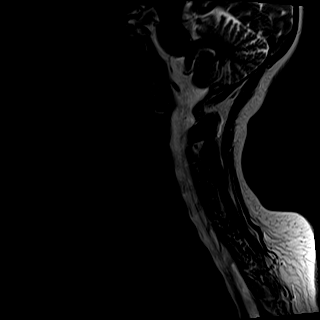
[im 8/15]
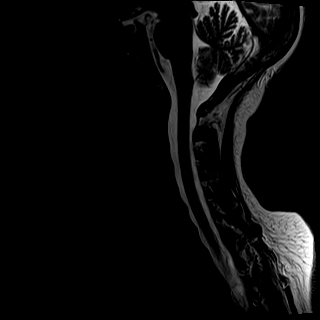
[im 10/15]
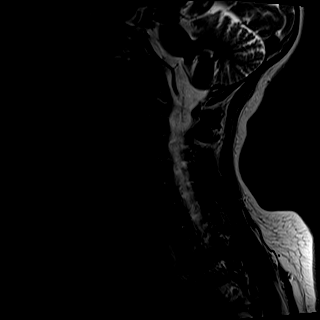
[im 12/15]
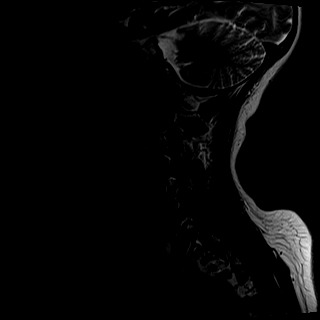
[im 15/15]
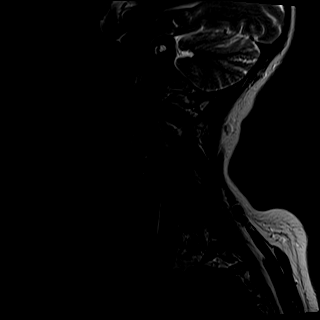

[Series 3: T1 · sagittal · 3.0mm · 0.41mm/px · 7 of 15 slices shown]
[im 1/15]
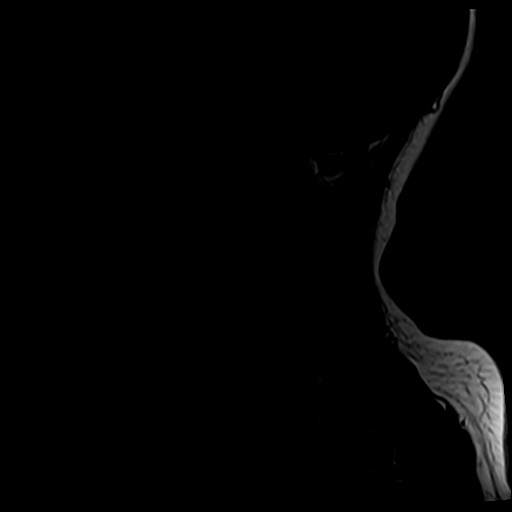
[im 3/15]
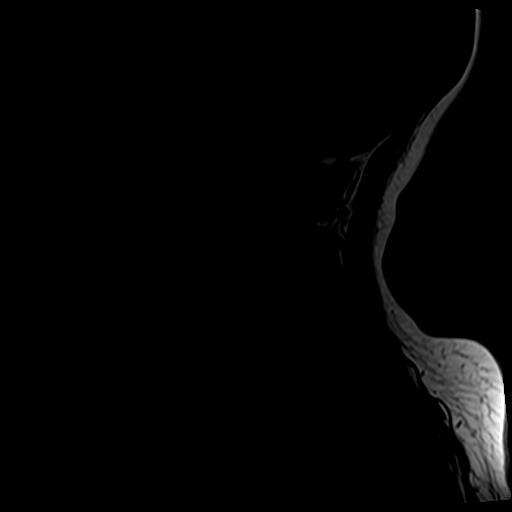
[im 5/15]
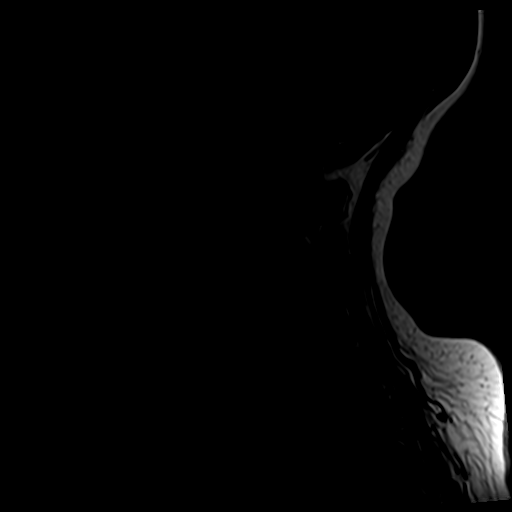
[im 8/15]
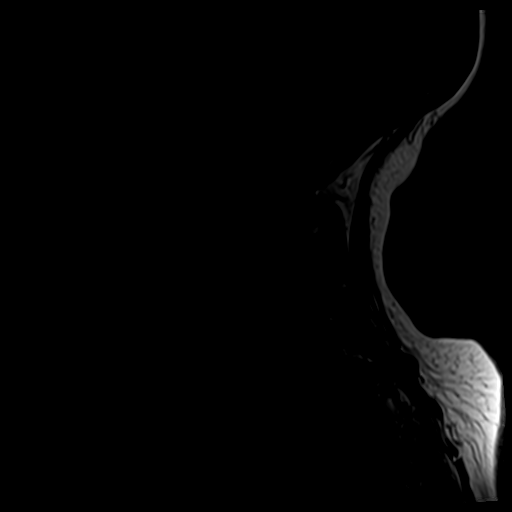
[im 10/15]
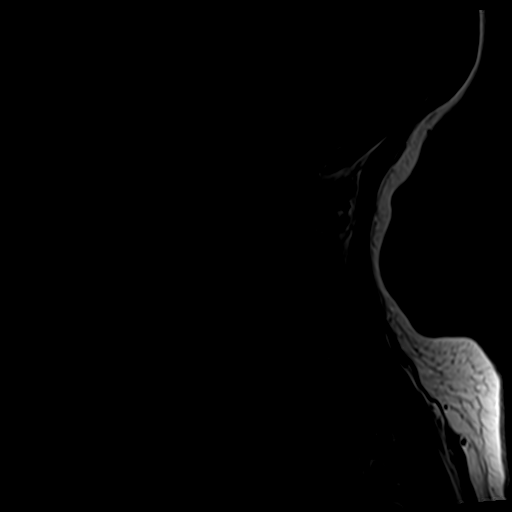
[im 12/15]
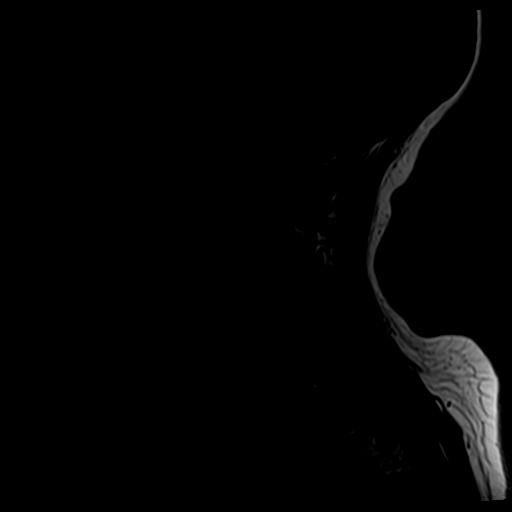
[im 15/15]
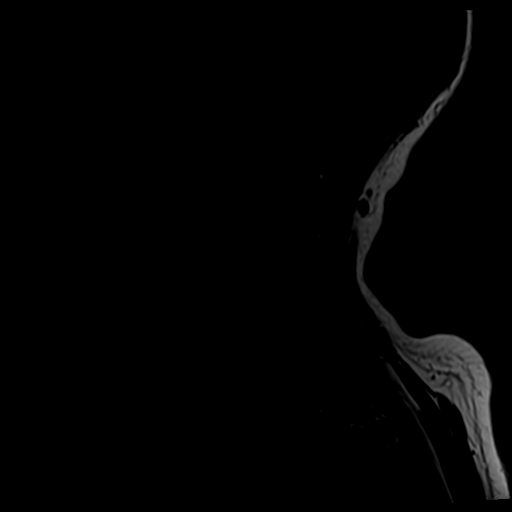

[Series 4: tir sag · sagittal · 3.0mm · 0.41mm/px · 6 of 15 slices shown]
[im 1/15]
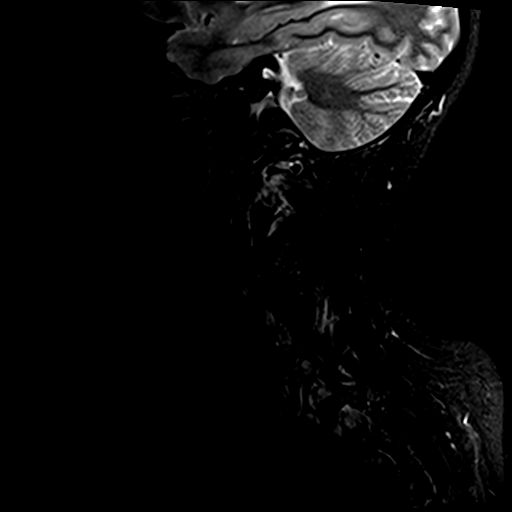
[im 3/15]
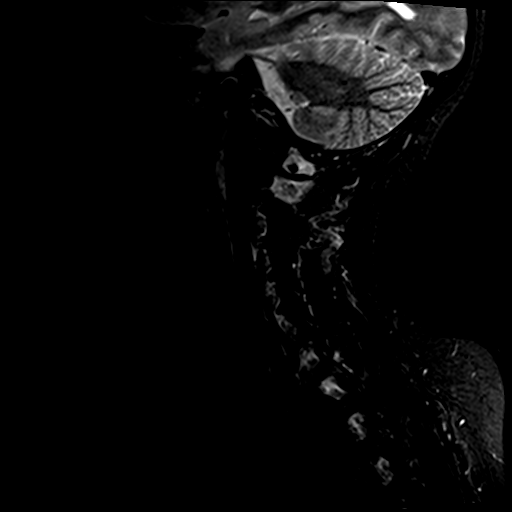
[im 5/15]
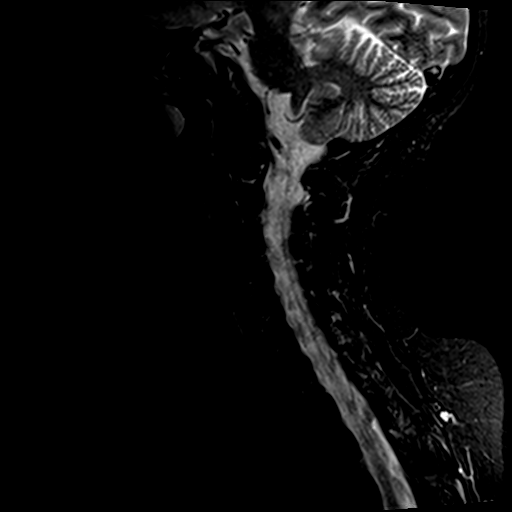
[im 7/15]
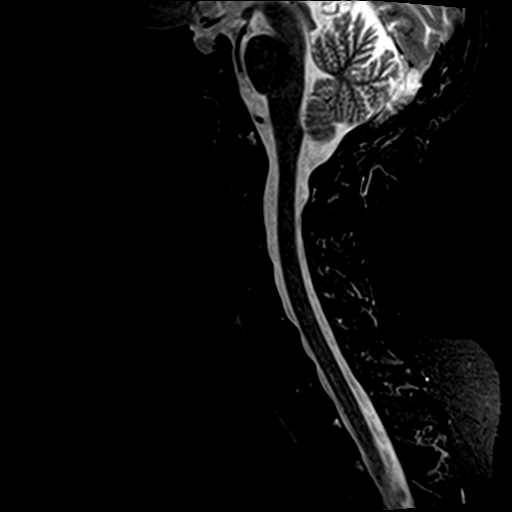
[im 9/15]
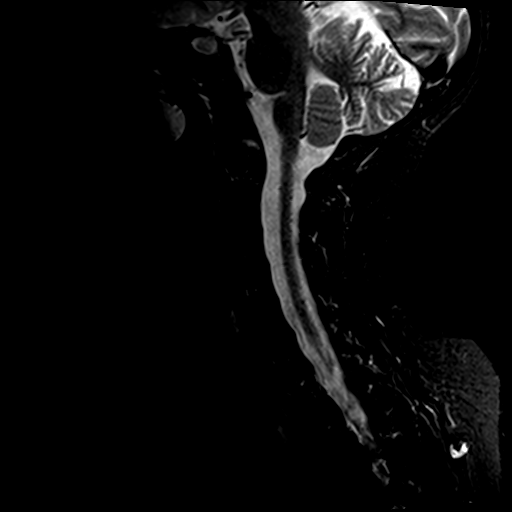
[im 13/15]
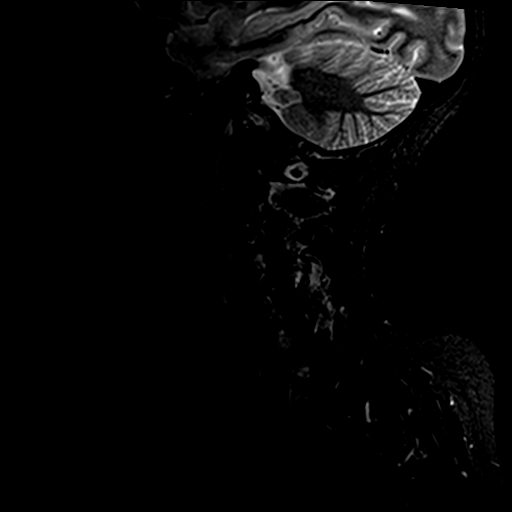

[Series 6: T2 · axial · 3.0mm · 0.70mm/px · z∈[-26,+60]mm · 9 of 25 slices shown (2 of 2)]
[im 1/25]
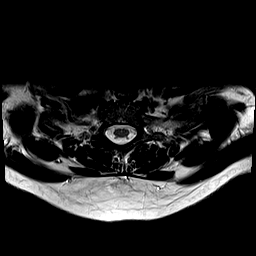
[im 5/25]
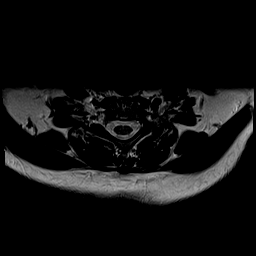
[im 9/25]
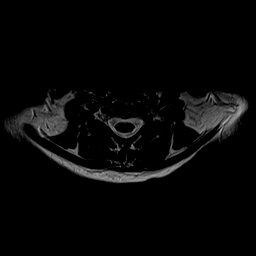
[im 11/25]
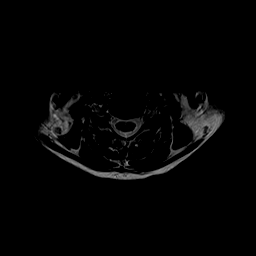
[im 13/25]
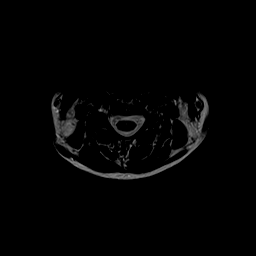
[im 15/25]
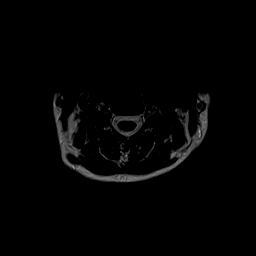
[im 17/25]
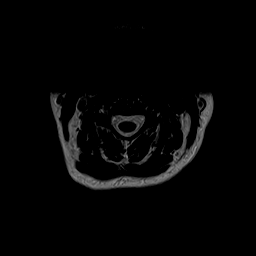
[im 21/25]
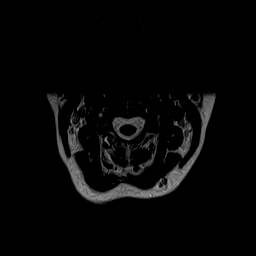
[im 25/25]
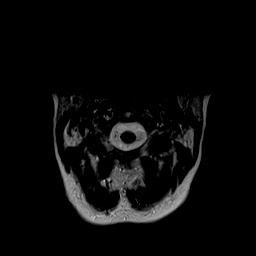

[29 of 48 positions shown; findings below may reference images not displayed]

FINDINGS: Alignment: Straightening of the normal cervical lordosis. No
listhesis.

Vertebrae: Vertebral body height maintained without acute or chronic
fracture. Bone marrow signal intensity within normal limits. No
discrete or worrisome osseous lesions. No abnormal marrow edema.

Cord: Normal signal morphology.

Posterior Fossa, vertebral arteries, paraspinal tissues: Visualized
brain and posterior fossa within normal limits. Craniocervical
junction normal. Paraspinous and prevertebral soft tissues within
normal limits. Normal intravascular flow voids seen within the
vertebral arteries bilaterally.

Disc levels:

C2-C3: Unremarkable.

C3-C4: Mild disc bulge. No spinal stenosis. Foramina remain patent.

C4-C5: Negative interspace. Minimal right-sided facet hypertrophy.
No canal or foraminal stenosis.

C5-C6: Mild disc bulge. Minimal indentation of the ventral thecal
sac without significant spinal stenosis or cord deformity. Foramina
remain patent.

C6-C7: Mild disc bulge. Mild flattening of the ventral thecal sac
without significant spinal stenosis. Foramina remain patent.

C7-T1:  Unremarkable.

Visualized upper thoracic spine demonstrates no significant finding.
IMPRESSION: 1. Mild noncompressive disc bulging at C3-4 through C6-7 without
significant stenosis or neural impingement.
2. Otherwise unremarkable and normal MRI of the cervical spine.

## 2021-07-18 IMAGING — MR MR SHOULDER*R* W/O CM
5 series · 37 of 40 positions shown · non-contrast
Comparison: None.

CLINICAL DATA: Chronic right shoulder and neck pain with right arm
weakness. Patient notes pain has caused migraine headaches for
several years.

EXAM:
MRI OF THE RIGHT SHOULDER WITHOUT CONTRAST
TECHNIQUE: Multiplanar, multisequence MR imaging of the shoulder was performed.
No intravenous contrast was administered.

[Series 4: T2 fat-sat · oblique · 4.0mm · 0.59mm/px · 9 of 21 slices shown (1 of 3)]
[im 1/21]
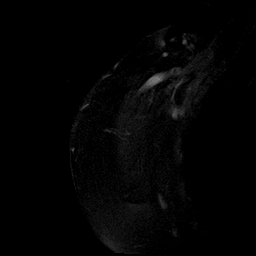
[im 3/21]
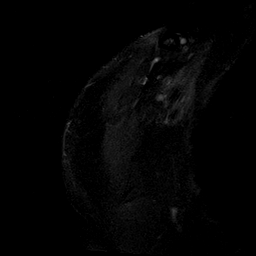
[im 6/21]
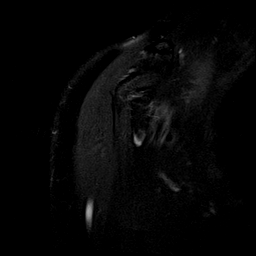
[im 8/21]
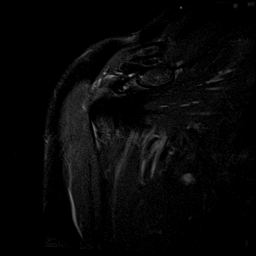
[im 11/21]
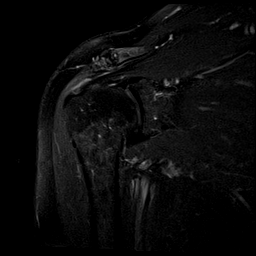
[im 13/21]
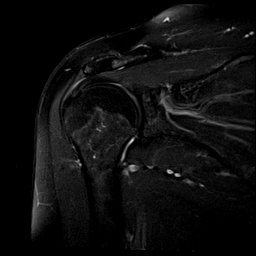
[im 16/21]
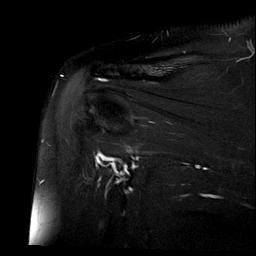
[im 18/21]
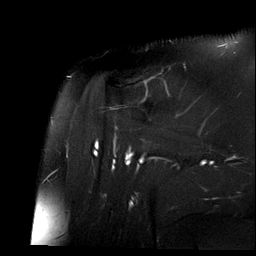
[im 21/21]
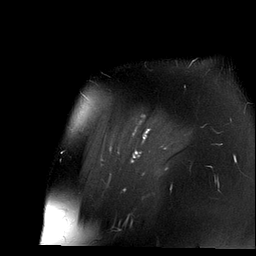

[Series 5: PD · oblique · 4.0mm · 0.29mm/px · 8 of 21 slices shown]
[im 1/21]
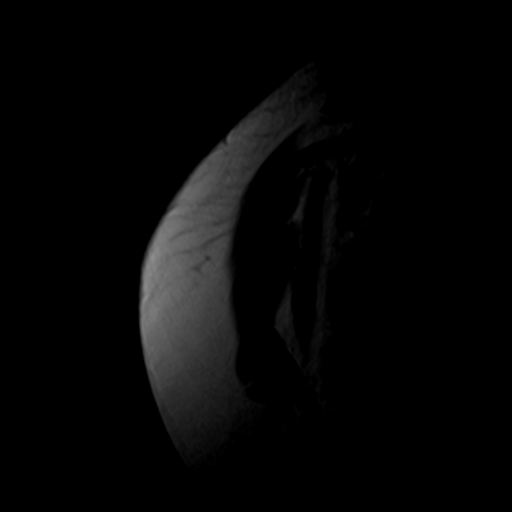
[im 3/21]
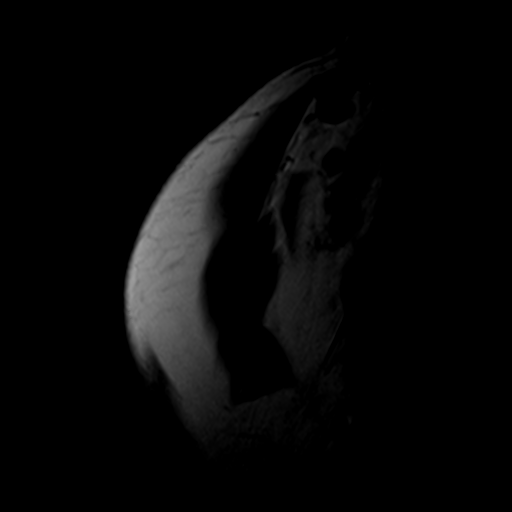
[im 6/21]
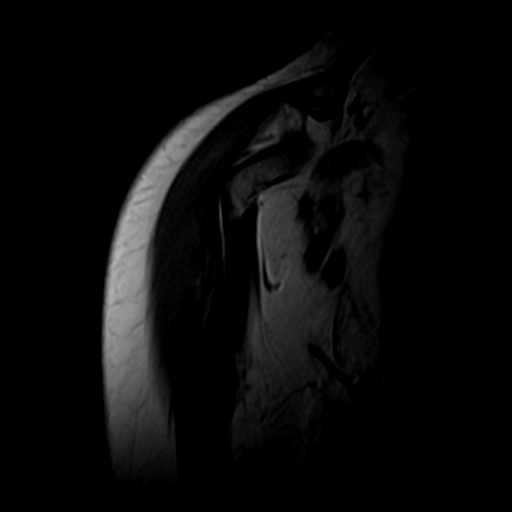
[im 9/21]
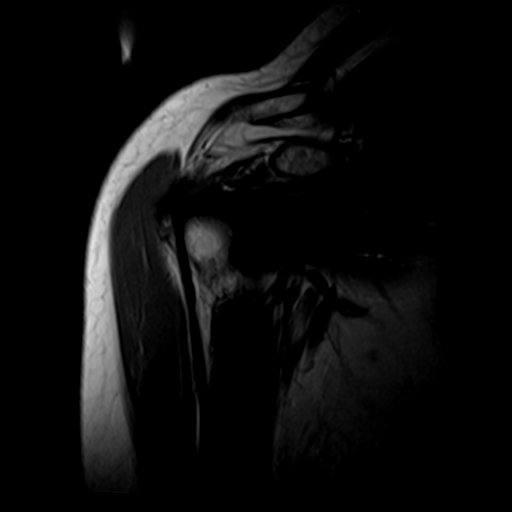
[im 12/21]
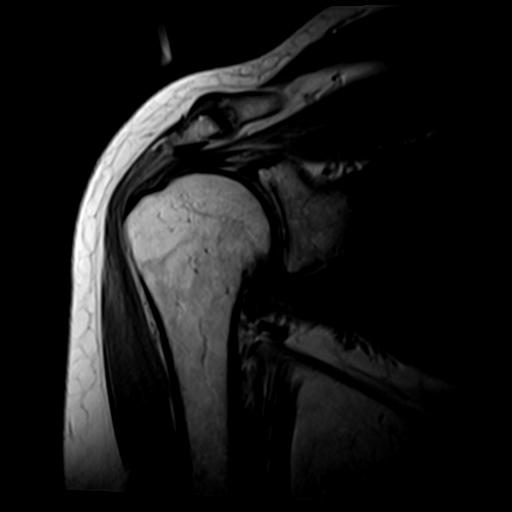
[im 15/21]
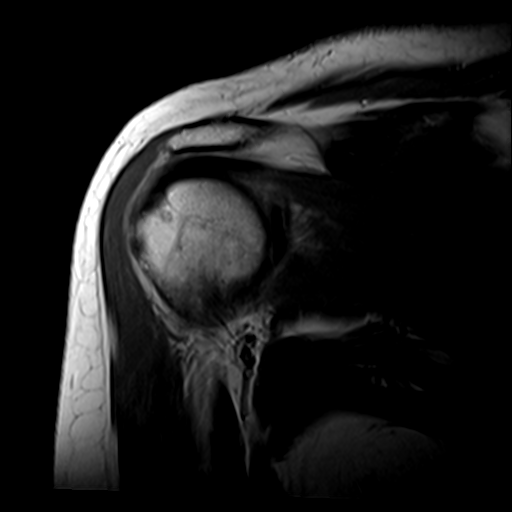
[im 18/21]
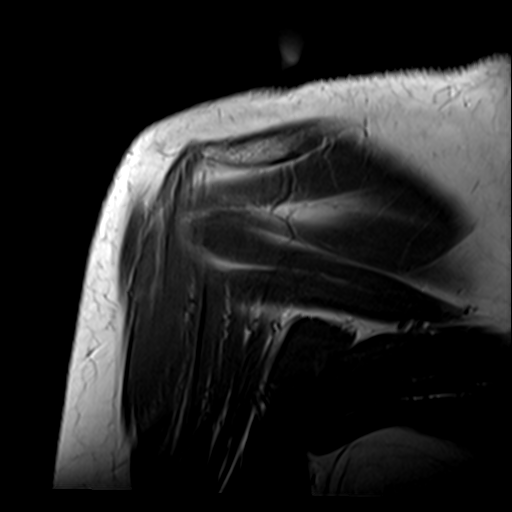
[im 21/21]
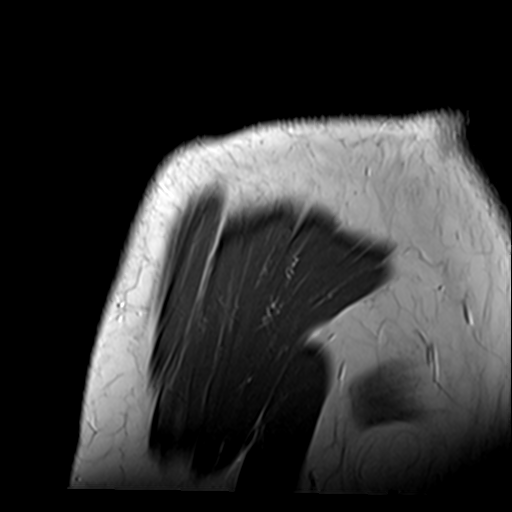

[Series 6: T2 fat-sat · oblique · 4.0mm · 0.59mm/px · 7 of 20 slices shown (2 of 3)]
[im 1/20]
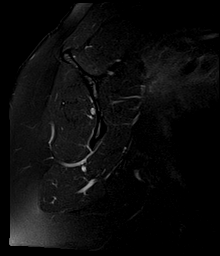
[im 4/20]
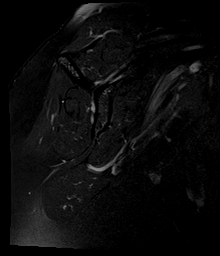
[im 7/20]
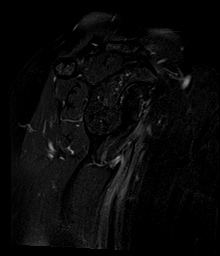
[im 10/20]
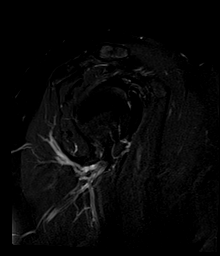
[im 13/20]
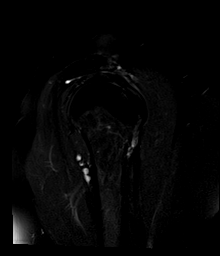
[im 16/20]
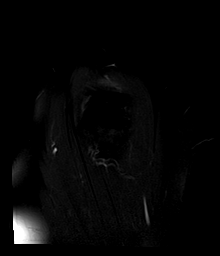
[im 20/20]
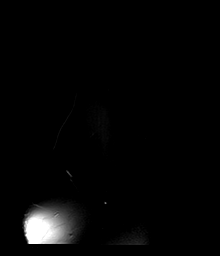

[Series 7: T1 · oblique · 4.0mm · 0.29mm/px · 4 of 20 slices shown]
[im 1/20]
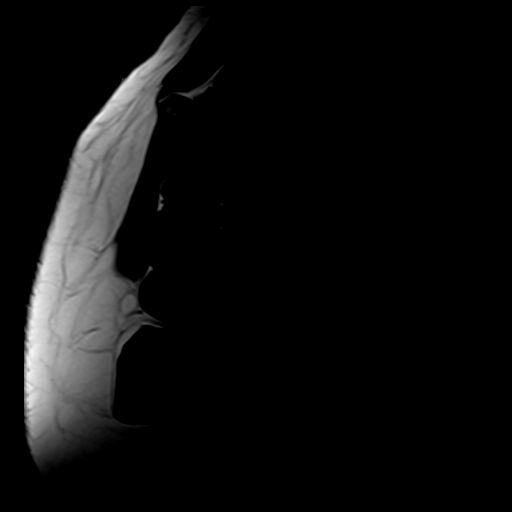
[im 4/20]
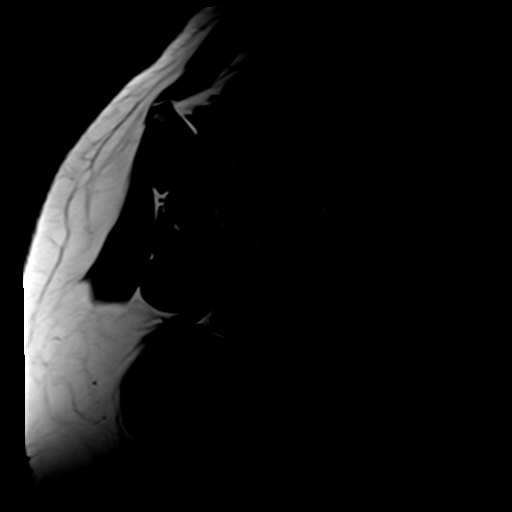
[im 7/20]
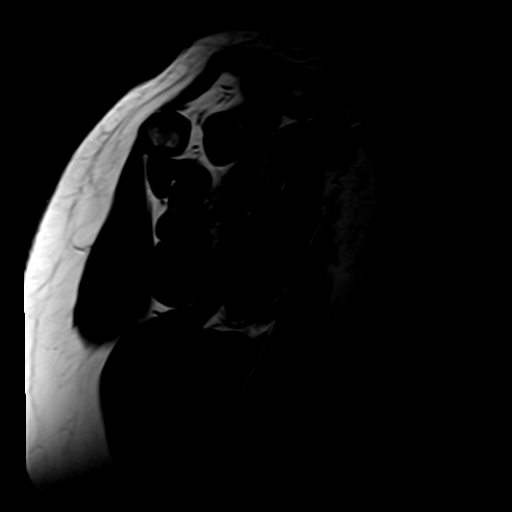
[im 10/20]
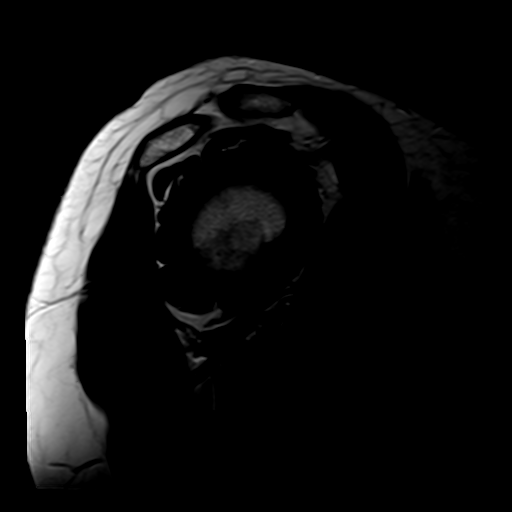

[Series 8: T2 fat-sat · axial · 4.0mm · 0.55mm/px · z∈[-38,+69]mm · 9 of 25 slices shown (3 of 3)]
[im 1/25]
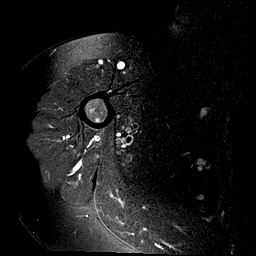
[im 4/25]
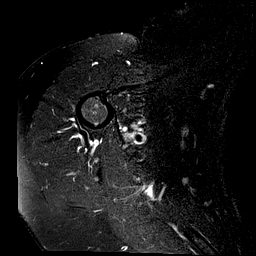
[im 7/25]
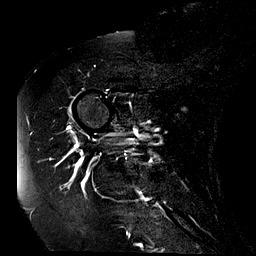
[im 10/25]
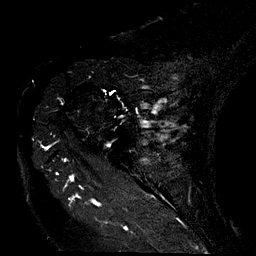
[im 13/25]
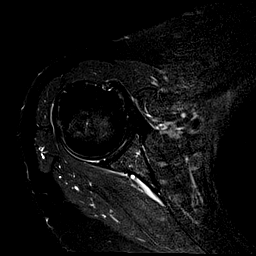
[im 16/25]
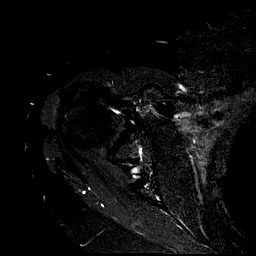
[im 19/25]
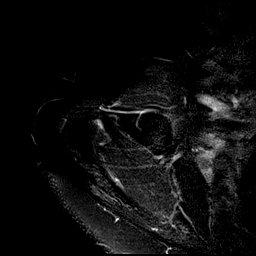
[im 22/25]
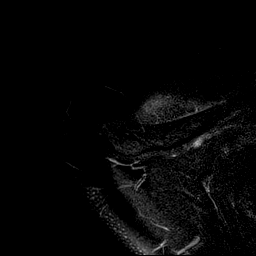
[im 25/25]
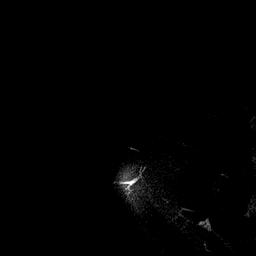

[37 of 40 positions shown; findings below may reference images not displayed]

FINDINGS: Rotator cuff: Mild tendinosis of the supraspinatus, infraspinatus,
and subscapularis without discrete tear. The teres minor tendon is
intact.

Muscles: Preserved bulk and signal intensity of the rotator cuff
musculature without edema, atrophy, or fatty infiltration.

Biceps long head: Intact. Suspect mild tendinosis within the groove
entry zone.

Acromioclavicular Joint: No significant degenerative changes of the
AC joint. Small volume subacromial-subdeltoid bursal fluid.

Glenohumeral Joint: No joint effusion. No focal cartilage defect.

Labrum: Grossly intact although evaluation is limited in the absence
of intra-articular fluid. No paralabral cyst.

Bones: No acute fracture. No dislocation. No bone marrow edema. No
marrow replacing bone lesion.

Other: None.
IMPRESSION: 1. Mild rotator cuff tendinosis without discrete tear.
2. Mild subacromial-subdeltoid bursitis.
3. Suspect mild biceps tendinosis within the groove entry zone.

## 2021-07-19 DIAGNOSIS — M5136 Other intervertebral disc degeneration, lumbar region: Secondary | ICD-10-CM | POA: Diagnosis not present

## 2021-07-22 DIAGNOSIS — S46911S Strain of unspecified muscle, fascia and tendon at shoulder and upper arm level, right arm, sequela: Secondary | ICD-10-CM | POA: Diagnosis not present

## 2021-07-22 DIAGNOSIS — M5412 Radiculopathy, cervical region: Secondary | ICD-10-CM | POA: Diagnosis not present

## 2021-07-23 DIAGNOSIS — M542 Cervicalgia: Secondary | ICD-10-CM | POA: Diagnosis not present

## 2021-07-23 DIAGNOSIS — M25511 Pain in right shoulder: Secondary | ICD-10-CM | POA: Diagnosis not present

## 2021-07-26 DIAGNOSIS — M5136 Other intervertebral disc degeneration, lumbar region: Secondary | ICD-10-CM | POA: Diagnosis not present

## 2021-07-27 ENCOUNTER — Encounter: Payer: Self-pay | Admitting: Gastroenterology

## 2021-07-28 DIAGNOSIS — M25511 Pain in right shoulder: Secondary | ICD-10-CM | POA: Diagnosis not present

## 2021-07-28 DIAGNOSIS — M791 Myalgia, unspecified site: Secondary | ICD-10-CM | POA: Diagnosis not present

## 2021-08-20 ENCOUNTER — Other Ambulatory Visit: Payer: Self-pay

## 2021-08-20 ENCOUNTER — Ambulatory Visit (AMBULATORY_SURGERY_CENTER): Payer: BC Managed Care – PPO | Admitting: *Deleted

## 2021-08-20 VITALS — Ht 63.5 in | Wt 146.0 lb

## 2021-08-20 DIAGNOSIS — Z1211 Encounter for screening for malignant neoplasm of colon: Secondary | ICD-10-CM

## 2021-08-20 MED ORDER — NA SULFATE-K SULFATE-MG SULF 17.5-3.13-1.6 GM/177ML PO SOLN: 1.0000 | ORAL | 0 refills | Status: DC

## 2021-08-20 NOTE — Progress Notes (Signed)
Patient's pre-visit was done today over the phone with the patient. Name,DOB and address verified. Patient denies any allergies to Eggs and Soy. Patient denies any problems with anesthesia/sedation. Patient is not taking any diet pills or blood thinners. No home Oxygen.  ° °Prep instructions sent to pt's email-pt aware. Patient understands to call us back with any questions or concerns. Patient is aware of our care-partner policy and Covid-19 safety protocol.  ° °The patient is COVID-19 vaccinated.   °

## 2021-08-23 DIAGNOSIS — M25511 Pain in right shoulder: Secondary | ICD-10-CM | POA: Diagnosis not present

## 2021-08-27 DIAGNOSIS — M25511 Pain in right shoulder: Secondary | ICD-10-CM | POA: Diagnosis not present

## 2021-08-30 ENCOUNTER — Encounter: Payer: Self-pay | Admitting: Gastroenterology

## 2021-08-30 DIAGNOSIS — M25511 Pain in right shoulder: Secondary | ICD-10-CM | POA: Diagnosis not present

## 2021-09-03 ENCOUNTER — Other Ambulatory Visit: Payer: Self-pay

## 2021-09-03 ENCOUNTER — Ambulatory Visit (AMBULATORY_SURGERY_CENTER): Payer: BC Managed Care – PPO | Admitting: Gastroenterology

## 2021-09-03 ENCOUNTER — Encounter: Payer: Self-pay | Admitting: Gastroenterology

## 2021-09-03 VITALS — BP 116/72 | HR 66 | Temp 98.0°F | Resp 13 | Wt 146.0 lb

## 2021-09-03 DIAGNOSIS — Z1211 Encounter for screening for malignant neoplasm of colon: Secondary | ICD-10-CM | POA: Diagnosis not present

## 2021-09-03 MED ORDER — SODIUM CHLORIDE 0.9 % IV SOLN: 500.0000 mL | Freq: Once | INTRAVENOUS | Status: DC

## 2021-09-03 NOTE — Progress Notes (Signed)
[facility] History and Physical ? ? ?Primary Care Physician:  Velna Hatchet, MD ? ? ?Reason for Procedure:  Colorectal cancer screening ? ?Plan:    Screening colonoscopy with possible interventions as needed ? ? ? ? ?HPI: Maria Arias is a very pleasant 51 y.o. female here for screening colonoscopy. ?Denies any nausea, vomiting, abdominal pain, melena or bright red blood per rectum ? ?The risks and benefits as well as alternatives of endoscopic procedure(s) have been discussed and reviewed. All questions answered. The patient agrees to proceed. ? ? ? ?Past Medical History:  ?Diagnosis Date  ? Anal fissure   ? Anxiety   ? Arthritis   ? Depression   ? GERD (gastroesophageal reflux disease)   ? Hyperlipidemia   ? Irritable bowel disease   ? Migraines   ? Positive PPD   ? Positive Gold PPD , Negative Chest Xray  ? ? ?Past Surgical History:  ?Procedure Laterality Date  ? ANKLE SURGERY Left   ? APPENDECTOMY    ? KNEE ARTHROSCOPY Right   ? ? ?Prior to Admission medications   ?Medication Sig Start Date End Date Taking? Authorizing Provider  ?LORazepam (ATIVAN) 0.5 MG tablet take 1 tab daily as needed for anxiety 04/27/20   [provider]  ?Na Sulfate-K Sulfate-Mg Sulf 17.5-3.13-1.6 GM/177ML SOLN Take 1 kit by mouth as directed. May use generic Suprep 08/20/21   Mauri Pole, MD  ?sertraline (ZOLOFT) 100 MG tablet TAKE 1 TABLET (100 MG TOTAL) BY MOUTH DAILY. TAKE 1 AND A 1/2 PILLS =150MG DAILY 08/09/14   Vicie Mutters R, PA-C  ?tiZANidine (ZANAFLEX) 2 MG tablet Take 2 mg by mouth at bedtime. 07/23/21   [provider]  ?traZODone (DESYREL) 100 MG tablet TAKE 1 & 1/2 TABLETS AT BEDTIME 10/28/13   Kelby Aline, PA-C  ? ? ?Current Outpatient Medications  ?Medication Sig Dispense Refill  ? LORazepam (ATIVAN) 0.5 MG tablet take 1 tab daily as needed for anxiety    ? Na Sulfate-K Sulfate-Mg Sulf 17.5-3.13-1.6 GM/177ML SOLN Take 1 kit by mouth as directed. May use generic Suprep 354 mL 0   ? sertraline (ZOLOFT) 100 MG tablet TAKE 1 TABLET (100 MG TOTAL) BY MOUTH DAILY. TAKE 1 AND A 1/2 PILLS =150MG DAILY 135 tablet 2  ? tiZANidine (ZANAFLEX) 2 MG tablet Take 2 mg by mouth at bedtime.    ? traZODone (DESYREL) 100 MG tablet TAKE 1 & 1/2 TABLETS AT BEDTIME 45 tablet 1  ? ?No current facility-administered medications for this visit.  ? ? ?Allergies as of 09/03/2021 - Review Complete 08/20/2021  ?Allergen Reaction Noted  ? Ppd [tuberculin purified protein derivative]  07/08/2013  ? ? ?Family History  ?Problem Relation Age of Onset  ? Depression Mother   ? Hypertension Father   ? Hyperlipidemia Father   ? Diabetes Paternal Uncle   ? Heart disease Maternal Grandfather   ? Colon cancer Neg Hx   ? Esophageal cancer Neg Hx   ? Colon polyps Neg Hx   ? Rectal cancer Neg Hx   ? Stomach cancer Neg Hx   ? ? ?Social History  ? ?Socioeconomic History  ? Marital status: Legally Separated  ?  Spouse name: Not on file  ? Number of children: Not on file  ? Years of education: Not on file  ? Highest education level: Not on file  ?Occupational History  ? Not on file  ?Tobacco Use  ? Smoking status: Never  ? Smokeless tobacco: Never  ?  Vaping Use  ? Vaping Use: Never used  ?Substance and Sexual Activity  ? Alcohol use: Yes  ?  Comment: socially  ? Drug use: No  ? Sexual activity: Not on file  ?Other Topics Concern  ? Not on file  ?Social History Narrative  ? Not on file  ? ?Social Determinants of Health  ? ?Financial Resource Strain: Not on file  ?Food Insecurity: Not on file  ?Transportation Needs: Not on file  ?Physical Activity: Not on file  ?Stress: Not on file  ?Social Connections: Not on file  ?Intimate Partner Violence: Not on file  ? ? ?Review of Systems: ? ?All other review of systems negative except as mentioned in the HPI. ? ?Physical Exam: ?Vital signs in last 24 hours: ?There were no vitals taken for this visit. ?General:   Alert, NAD ?Lungs:  Clear .   ?Heart:  Regular rate and rhythm ?Abdomen:  Soft, nontender  and nondistended. ?Neuro/Psych:  Alert and cooperative. Normal mood and affect. A and O x 3 ? ?Reviewed labs, radiology imaging, old records and pertinent past GI work up ? ?Patient is appropriate for planned procedure(s) and anesthesia in an ambulatory setting ? ? ?K. Denzil Magnuson , MD ?[phone]  ? ? ?  ?

## 2021-09-03 NOTE — Progress Notes (Signed)
Vitals-DT 

## 2021-09-03 NOTE — Op Note (Signed)
Maria Arias ?Patient Name: Maria Arias ?Procedure Date: 09/03/2021 2:26 PM ?MRN: [id number] ?Endoscopist: Napoleon FormKavitha V. Ayren Zumbro , MD ?Age: 51 ?Referring MD:  ?Date of Birth: 1970/10/27 ?Gender: Female ?Account #: [id number] ?Procedure:                Colonoscopy ?Indications:              Screening for colorectal malignant neoplasm ?Medicines:                Monitored Anesthesia Care ?Procedure:                Pre-Anesthesia Assessment: ?                          - Prior to the procedure, a History and Physical  ?                          was performed, and patient medications and  ?                          allergies were reviewed. The patient's tolerance of  ?                          previous anesthesia was also reviewed. The risks  ?                          and benefits of the procedure and the sedation  ?                          options and risks were discussed with the patient.  ?                          All questions were answered, and informed consent  ?                          was obtained. Prior Anticoagulants: The patient has  ?                          taken no previous anticoagulant or antiplatelet  ?                          agents. ASA Grade Assessment: II - A patient with  ?                          mild systemic disease. After reviewing the risks  ?                          and benefits, the patient was deemed in  ?                          satisfactory condition to undergo the procedure. ?                          After obtaining informed consent, the colonoscope  ?  was passed under direct vision. Throughout the  ?                          procedure, the patient's blood pressure, pulse, and  ?                          oxygen saturations were monitored continuously. The  ?                          Olympus PCF-H190DL (#[id number]) Colonoscope was  ?                          introduced through the anus and advanced to the the  ?                          cecum,  identified by appendiceal orifice and  ?                          ileocecal valve. The colonoscopy was performed  ?                          without difficulty. The patient tolerated the  ?                          procedure well. The quality of the bowel  ?                          preparation was adequate. The ileocecal valve,  ?                          appendiceal orifice, and rectum were photographed. ?Scope In: 2:28:58 PM ?Scope Out: 2:47:55 PM ?Scope Withdrawal Time: 0 hours 10 minutes 30 seconds  ?Total Procedure Duration: 0 hours 18 minutes 57 seconds  ?Findings:                 The perianal and digital rectal examinations were  ?                          normal. ?                          Non-bleeding external and internal hemorrhoids were  ?                          found during retroflexion. The hemorrhoids were  ?                          medium-sized. ?                          The exam was otherwise without abnormality. ?Complications:            No immediate complications. ?Estimated Blood Loss:     Estimated blood loss was minimal. ?Impression:               - Non-bleeding external and internal hemorrhoids. ?                          -  The examination was otherwise normal. ?                          - No specimens collected. ?Recommendation:           - Patient has a contact number available for  ?                          emergencies. The signs and symptoms of potential  ?                          delayed complications were discussed with the  ?                          patient. Return to normal activities tomorrow.  ?                          Written discharge instructions were provided to the  ?                          patient. ?                          - Resume previous diet. ?                          - Continue present medications. ?                          - Repeat colonoscopy in 10 years for screening  ?                          purposes. ?Napoleon Form, MD ?09/03/2021 2:51:52  PM ?This report has been signed electronically. ?

## 2021-09-03 NOTE — Patient Instructions (Addendum)
Handouts on hemorrhoids and hemorrhoidal banding given to you today  ?Call to schedule appointment if needed  ? ?YOU HAD AN ENDOSCOPIC PROCEDURE TODAY AT THE [facility]:   Refer to the procedure report that was given to you for any specific questions about what was found during the examination.  If the procedure report does not answer your questions, please call your gastroenterologist to clarify.  If you requested that your care partner not be given the details of your procedure findings, then the procedure report has been included in a sealed envelope for you to review at your convenience later. ? ?YOU SHOULD EXPECT: Some feelings of bloating in the abdomen. Passage of more gas than usual.  Walking can help get rid of the air that was put into your GI tract during the procedure and reduce the bloating. If you had a lower endoscopy (such as a colonoscopy or flexible sigmoidoscopy) you may notice spotting of blood in your stool or on the toilet paper. If you underwent a bowel prep for your procedure, you may not have a normal bowel movement for a few days. ? ?Please Note:  You might notice some irritation and congestion in your nose or some drainage.  This is from the oxygen used during your procedure.  There is no need for concern and it should clear up in a day or so. ? ?SYMPTOMS TO REPORT IMMEDIATELY: ? ?Following lower endoscopy (colonoscopy or flexible sigmoidoscopy): ? Excessive amounts of blood in the stool ? Significant tenderness or worsening of abdominal pains ? Swelling of the abdomen that is new, acute ? Fever of 100?F or higher ? ?For urgent or emergent issues, a gastroenterologist can be reached at any hour by calling [phone]. ?Do not use MyChart messaging for urgent concerns.  ? ? ?DIET:  We do recommend a small meal at first, but then you may proceed to your regular diet.  Drink plenty of fluids but you should avoid alcoholic beverages for 24 hours. ? ?ACTIVITY:  You should plan  to take it easy for the rest of today and you should NOT DRIVE or use heavy machinery until tomorrow (because of the sedation medicines used during the test).   ? ?FOLLOW UP: ?Our staff will call the number listed on your records 48-72 hours following your procedure to check on you and address any questions or concerns that you may have regarding the information given to you following your procedure. If we do not reach you, we will leave a message.  We will attempt to reach you two times.  During this call, we will ask if you have developed any symptoms of COVID 19. If you develop any symptoms (ie: fever, flu-like symptoms, shortness of breath, cough etc.) before then, please call [phone].  If you test positive for Covid 19 in the 2 weeks post procedure, please call and report this information to [place].   ? ?SIGNATURES/CONFIDENTIALITY: ?You and/or your care partner have signed paperwork which will be entered into your electronic medical record.  These signatures attest to the fact that that the information above on your After Visit Summary has been reviewed and is understood.  Full responsibility of the confidentiality of this discharge information lies with you and/or your care-partner.  ?

## 2021-09-03 NOTE — Progress Notes (Signed)
Report to PACU, RN, vss, BBS= Clear.  

## 2021-09-07 ENCOUNTER — Telehealth: Payer: Self-pay | Admitting: *Deleted

## 2021-09-07 ENCOUNTER — Telehealth: Payer: Self-pay

## 2021-09-07 NOTE — Telephone Encounter (Signed)
?  Follow up Call- ? ?Call back number 09/03/2021  ?Post procedure Call Back phone  # [phone]  ?Permission to leave phone message Yes  ?Some recent data might be hidden  ? LMOM to call back with any questions or concerns.  Also, call back if patient has developed fever, respiratory issues or been dx with COVID or had any family members or close contacts diagnosed since her procedure. ? ?

## 2021-09-07 NOTE — Telephone Encounter (Signed)
Left message on follow up call. 

## 2021-09-10 DIAGNOSIS — M25511 Pain in right shoulder: Secondary | ICD-10-CM | POA: Diagnosis not present

## 2021-09-15 DIAGNOSIS — M25511 Pain in right shoulder: Secondary | ICD-10-CM | POA: Diagnosis not present

## 2021-09-21 DIAGNOSIS — M25511 Pain in right shoulder: Secondary | ICD-10-CM | POA: Diagnosis not present

## 2021-09-24 DIAGNOSIS — M25511 Pain in right shoulder: Secondary | ICD-10-CM | POA: Diagnosis not present

## 2021-09-28 DIAGNOSIS — M25511 Pain in right shoulder: Secondary | ICD-10-CM | POA: Diagnosis not present

## 2021-10-04 DIAGNOSIS — M25511 Pain in right shoulder: Secondary | ICD-10-CM | POA: Diagnosis not present

## 2021-10-11 DIAGNOSIS — M25511 Pain in right shoulder: Secondary | ICD-10-CM | POA: Diagnosis not present

## 2021-10-20 DIAGNOSIS — M25511 Pain in right shoulder: Secondary | ICD-10-CM | POA: Diagnosis not present

## 2021-11-05 DIAGNOSIS — M25511 Pain in right shoulder: Secondary | ICD-10-CM | POA: Diagnosis not present

## 2021-11-19 DIAGNOSIS — M25511 Pain in right shoulder: Secondary | ICD-10-CM | POA: Diagnosis not present

## 2021-12-02 DIAGNOSIS — H52203 Unspecified astigmatism, bilateral: Secondary | ICD-10-CM | POA: Diagnosis not present

## 2021-12-02 DIAGNOSIS — H5213 Myopia, bilateral: Secondary | ICD-10-CM | POA: Diagnosis not present

## 2021-12-03 DIAGNOSIS — M25511 Pain in right shoulder: Secondary | ICD-10-CM | POA: Diagnosis not present

## 2021-12-10 DIAGNOSIS — M25511 Pain in right shoulder: Secondary | ICD-10-CM | POA: Diagnosis not present

## 2021-12-24 DIAGNOSIS — M25511 Pain in right shoulder: Secondary | ICD-10-CM | POA: Diagnosis not present

## 2022-01-04 DIAGNOSIS — Z1231 Encounter for screening mammogram for malignant neoplasm of breast: Secondary | ICD-10-CM | POA: Diagnosis not present

## 2022-01-04 DIAGNOSIS — Z6831 Body mass index (BMI) 31.0-31.9, adult: Secondary | ICD-10-CM | POA: Diagnosis not present

## 2022-01-04 DIAGNOSIS — Z01419 Encounter for gynecological examination (general) (routine) without abnormal findings: Secondary | ICD-10-CM | POA: Diagnosis not present

## 2022-01-04 DIAGNOSIS — N870 Mild cervical dysplasia: Secondary | ICD-10-CM | POA: Diagnosis not present

## 2022-01-14 DIAGNOSIS — M25511 Pain in right shoulder: Secondary | ICD-10-CM | POA: Diagnosis not present

## 2022-01-24 DIAGNOSIS — M25511 Pain in right shoulder: Secondary | ICD-10-CM | POA: Diagnosis not present

## 2022-02-11 DIAGNOSIS — M25511 Pain in right shoulder: Secondary | ICD-10-CM | POA: Diagnosis not present

## 2022-02-22 DIAGNOSIS — F338 Other recurrent depressive disorders: Secondary | ICD-10-CM | POA: Diagnosis not present

## 2022-02-22 DIAGNOSIS — F419 Anxiety disorder, unspecified: Secondary | ICD-10-CM | POA: Diagnosis not present

## 2022-02-22 DIAGNOSIS — R41840 Attention and concentration deficit: Secondary | ICD-10-CM | POA: Diagnosis not present

## 2022-02-24 DIAGNOSIS — R41840 Attention and concentration deficit: Secondary | ICD-10-CM | POA: Diagnosis not present

## 2022-03-08 DIAGNOSIS — M25511 Pain in right shoulder: Secondary | ICD-10-CM | POA: Diagnosis not present

## 2022-03-31 DIAGNOSIS — Z79899 Other long term (current) drug therapy: Secondary | ICD-10-CM | POA: Diagnosis not present

## 2022-04-05 DIAGNOSIS — M25511 Pain in right shoulder: Secondary | ICD-10-CM | POA: Diagnosis not present

## 2022-04-13 DIAGNOSIS — F902 Attention-deficit hyperactivity disorder, combined type: Secondary | ICD-10-CM | POA: Diagnosis not present

## 2022-04-13 DIAGNOSIS — F419 Anxiety disorder, unspecified: Secondary | ICD-10-CM | POA: Diagnosis not present

## 2022-04-13 DIAGNOSIS — Z79899 Other long term (current) drug therapy: Secondary | ICD-10-CM | POA: Diagnosis not present

## 2022-04-25 DIAGNOSIS — F419 Anxiety disorder, unspecified: Secondary | ICD-10-CM | POA: Diagnosis not present

## 2022-05-02 DIAGNOSIS — Z1331 Encounter for screening for depression: Secondary | ICD-10-CM | POA: Diagnosis not present

## 2022-05-02 DIAGNOSIS — Z1339 Encounter for screening examination for other mental health and behavioral disorders: Secondary | ICD-10-CM | POA: Diagnosis not present

## 2022-05-02 DIAGNOSIS — K219 Gastro-esophageal reflux disease without esophagitis: Secondary | ICD-10-CM | POA: Diagnosis not present

## 2022-05-02 DIAGNOSIS — M25511 Pain in right shoulder: Secondary | ICD-10-CM | POA: Diagnosis not present

## 2022-05-02 DIAGNOSIS — Z0000 Encounter for general adult medical examination without abnormal findings: Secondary | ICD-10-CM | POA: Diagnosis not present

## 2022-05-30 DIAGNOSIS — M25511 Pain in right shoulder: Secondary | ICD-10-CM | POA: Diagnosis not present

## 2022-06-22 DIAGNOSIS — M25511 Pain in right shoulder: Secondary | ICD-10-CM | POA: Diagnosis not present

## 2022-07-07 DIAGNOSIS — J069 Acute upper respiratory infection, unspecified: Secondary | ICD-10-CM | POA: Diagnosis not present

## 2022-07-07 DIAGNOSIS — R0981 Nasal congestion: Secondary | ICD-10-CM | POA: Diagnosis not present

## 2022-07-07 DIAGNOSIS — J029 Acute pharyngitis, unspecified: Secondary | ICD-10-CM | POA: Diagnosis not present

## 2022-07-15 DIAGNOSIS — M25511 Pain in right shoulder: Secondary | ICD-10-CM | POA: Diagnosis not present

## 2022-07-20 DIAGNOSIS — M542 Cervicalgia: Secondary | ICD-10-CM | POA: Diagnosis not present

## 2022-07-20 DIAGNOSIS — M79642 Pain in left hand: Secondary | ICD-10-CM | POA: Diagnosis not present

## 2022-07-20 DIAGNOSIS — M1991 Primary osteoarthritis, unspecified site: Secondary | ICD-10-CM | POA: Diagnosis not present

## 2022-07-20 DIAGNOSIS — M79641 Pain in right hand: Secondary | ICD-10-CM | POA: Diagnosis not present

## 2022-08-09 DIAGNOSIS — R5383 Other fatigue: Secondary | ICD-10-CM | POA: Diagnosis not present

## 2022-08-09 DIAGNOSIS — R0981 Nasal congestion: Secondary | ICD-10-CM | POA: Diagnosis not present

## 2022-08-09 DIAGNOSIS — R051 Acute cough: Secondary | ICD-10-CM | POA: Diagnosis not present

## 2022-08-09 DIAGNOSIS — J029 Acute pharyngitis, unspecified: Secondary | ICD-10-CM | POA: Diagnosis not present

## 2022-08-09 DIAGNOSIS — Z1152 Encounter for screening for COVID-19: Secondary | ICD-10-CM | POA: Diagnosis not present

## 2022-08-09 DIAGNOSIS — J069 Acute upper respiratory infection, unspecified: Secondary | ICD-10-CM | POA: Diagnosis not present

## 2022-08-19 DIAGNOSIS — M25511 Pain in right shoulder: Secondary | ICD-10-CM | POA: Diagnosis not present

## 2022-08-23 DIAGNOSIS — K219 Gastro-esophageal reflux disease without esophagitis: Secondary | ICD-10-CM | POA: Diagnosis not present

## 2022-08-23 DIAGNOSIS — F419 Anxiety disorder, unspecified: Secondary | ICD-10-CM | POA: Diagnosis not present

## 2022-08-23 DIAGNOSIS — E785 Hyperlipidemia, unspecified: Secondary | ICD-10-CM | POA: Diagnosis not present

## 2022-09-06 DIAGNOSIS — M25511 Pain in right shoulder: Secondary | ICD-10-CM | POA: Diagnosis not present

## 2022-10-06 DIAGNOSIS — R948 Abnormal results of function studies of other organs and systems: Secondary | ICD-10-CM | POA: Diagnosis not present

## 2022-10-06 DIAGNOSIS — R635 Abnormal weight gain: Secondary | ICD-10-CM | POA: Diagnosis not present

## 2022-11-09 DIAGNOSIS — M5451 Vertebrogenic low back pain: Secondary | ICD-10-CM | POA: Diagnosis not present

## 2022-11-09 DIAGNOSIS — M47816 Spondylosis without myelopathy or radiculopathy, lumbar region: Secondary | ICD-10-CM | POA: Diagnosis not present

## 2022-11-10 DIAGNOSIS — R632 Polyphagia: Secondary | ICD-10-CM | POA: Diagnosis not present

## 2022-11-10 DIAGNOSIS — E669 Obesity, unspecified: Secondary | ICD-10-CM | POA: Diagnosis not present

## 2022-11-10 DIAGNOSIS — E785 Hyperlipidemia, unspecified: Secondary | ICD-10-CM | POA: Diagnosis not present

## 2022-11-11 DIAGNOSIS — M5451 Vertebrogenic low back pain: Secondary | ICD-10-CM | POA: Diagnosis not present

## 2022-12-14 DIAGNOSIS — E669 Obesity, unspecified: Secondary | ICD-10-CM | POA: Diagnosis not present

## 2022-12-14 DIAGNOSIS — E785 Hyperlipidemia, unspecified: Secondary | ICD-10-CM | POA: Diagnosis not present

## 2022-12-14 DIAGNOSIS — R632 Polyphagia: Secondary | ICD-10-CM | POA: Diagnosis not present

## 2022-12-14 DIAGNOSIS — Z6833 Body mass index (BMI) 33.0-33.9, adult: Secondary | ICD-10-CM | POA: Diagnosis not present

## 2022-12-26 DIAGNOSIS — Z713 Dietary counseling and surveillance: Secondary | ICD-10-CM | POA: Diagnosis not present

## 2022-12-26 DIAGNOSIS — E669 Obesity, unspecified: Secondary | ICD-10-CM | POA: Diagnosis not present

## 2023-01-03 DIAGNOSIS — E669 Obesity, unspecified: Secondary | ICD-10-CM | POA: Diagnosis not present

## 2023-01-03 DIAGNOSIS — Z713 Dietary counseling and surveillance: Secondary | ICD-10-CM | POA: Diagnosis not present

## 2023-01-03 DIAGNOSIS — E785 Hyperlipidemia, unspecified: Secondary | ICD-10-CM | POA: Diagnosis not present

## 2023-01-03 DIAGNOSIS — R632 Polyphagia: Secondary | ICD-10-CM | POA: Diagnosis not present
